# Patient Record
Sex: Female | Born: 1990 | Race: Black or African American | Hispanic: No | Marital: Single | Smoking: Never smoker
Health system: Southern US, Community
[De-identification: ages and names within clinical notes are randomized; demographics above are authoritative.]

## PROBLEM LIST (undated history)

## (undated) ENCOUNTER — Inpatient Hospital Stay (HOSPITAL_COMMUNITY): Payer: Self-pay

## (undated) DIAGNOSIS — Z8742 Personal history of other diseases of the female genital tract: Secondary | ICD-10-CM

## (undated) DIAGNOSIS — D649 Anemia, unspecified: Secondary | ICD-10-CM

## (undated) DIAGNOSIS — D72829 Elevated white blood cell count, unspecified: Secondary | ICD-10-CM

## (undated) HISTORY — DX: Anemia, unspecified: D64.9

## (undated) HISTORY — DX: Personal history of other diseases of the female genital tract: Z87.42

## (undated) HISTORY — PX: NO PAST SURGERIES: SHX2092

## (undated) HISTORY — DX: Elevated white blood cell count, unspecified: D72.829

---
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
## 2014-06-01 ENCOUNTER — Other Ambulatory Visit (HOSPITAL_COMMUNITY)
Admission: RE | Admit: 2014-06-01 | Discharge: 2014-06-01 | Disposition: A | Discharge status: Home / Self Care | Payer: Commercial Indemnity | Source: Ambulatory Visit | Attending: Family Medicine | Admitting: Family Medicine

## 2014-06-01 ENCOUNTER — Other Ambulatory Visit: Payer: Self-pay | Admitting: Physician Assistant

## 2014-06-01 DIAGNOSIS — Z124 Encounter for screening for malignant neoplasm of cervix: Secondary | ICD-10-CM | POA: Diagnosis not present

## 2014-06-03 LAB — CYTOLOGY - PAP

## 2014-06-14 ENCOUNTER — Emergency Department (HOSPITAL_COMMUNITY)
Admission: EM | Admit: 2014-06-14 | Discharge: 2014-06-15 | Disposition: A | Discharge status: Home / Self Care | Payer: Commercial Indemnity | Attending: Emergency Medicine | Admitting: Emergency Medicine

## 2014-06-14 ENCOUNTER — Encounter (HOSPITAL_COMMUNITY): Payer: Self-pay | Admitting: Emergency Medicine

## 2014-06-14 DIAGNOSIS — R040 Epistaxis: Secondary | ICD-10-CM

## 2014-06-14 DIAGNOSIS — R109 Unspecified abdominal pain: Secondary | ICD-10-CM | POA: Insufficient documentation

## 2014-06-14 DIAGNOSIS — Z79899 Other long term (current) drug therapy: Secondary | ICD-10-CM | POA: Diagnosis not present

## 2014-06-14 DIAGNOSIS — J3489 Other specified disorders of nose and nasal sinuses: Secondary | ICD-10-CM | POA: Insufficient documentation

## 2014-06-14 DIAGNOSIS — Z792 Long term (current) use of antibiotics: Secondary | ICD-10-CM | POA: Insufficient documentation

## 2014-06-14 DIAGNOSIS — Z3202 Encounter for pregnancy test, result negative: Secondary | ICD-10-CM | POA: Diagnosis not present

## 2014-06-14 DIAGNOSIS — R1110 Vomiting, unspecified: Secondary | ICD-10-CM | POA: Diagnosis not present

## 2014-06-14 DIAGNOSIS — R1111 Vomiting without nausea: Secondary | ICD-10-CM

## 2014-06-14 LAB — CBC WITH DIFFERENTIAL/PLATELET
BASOS ABS: 0 10*3/uL (ref 0.0–0.1)
Basophils Relative: 0 % (ref 0–1)
EOS PCT: 2 % (ref 0–5)
Eosinophils Absolute: 0.2 10*3/uL (ref 0.0–0.7)
HCT: 38.2 % (ref 36.0–46.0)
Hemoglobin: 13.1 g/dL (ref 12.0–15.0)
LYMPHS PCT: 23 % (ref 12–46)
Lymphs Abs: 2.6 10*3/uL (ref 0.7–4.0)
MCH: 28.9 pg (ref 26.0–34.0)
MCHC: 34.3 g/dL (ref 30.0–36.0)
MCV: 84.1 fL (ref 78.0–100.0)
Monocytes Absolute: 0.6 10*3/uL (ref 0.1–1.0)
Monocytes Relative: 6 % (ref 3–12)
NEUTROS ABS: 7.7 10*3/uL (ref 1.7–7.7)
NEUTROS PCT: 69 % (ref 43–77)
PLATELETS: 301 10*3/uL (ref 150–400)
RBC: 4.54 MIL/uL (ref 3.87–5.11)
RDW: 12.8 % (ref 11.5–15.5)
WBC: 11.2 10*3/uL — AB (ref 4.0–10.5)

## 2014-06-14 LAB — POC URINE PREG, ED: Preg Test, Ur: NEGATIVE

## 2014-06-14 NOTE — ED Notes (Signed)
Pt states she has had 5-6 bouts of vomiting over the past 3 weeks and today she also had a nose bleed. States she was seen by PCP and UTI r/o since she was having pelvic pain. Also states she had a negative pregnancy test. Alert and oriented.

## 2014-06-15 LAB — COMPREHENSIVE METABOLIC PANEL
ALT: 14 U/L (ref 0–35)
AST: 18 U/L (ref 0–37)
Albumin: 3.8 g/dL (ref 3.5–5.2)
Alkaline Phosphatase: 54 U/L (ref 39–117)
Anion gap: 12 (ref 5–15)
BUN: 7 mg/dL (ref 6–23)
CALCIUM: 9.8 mg/dL (ref 8.4–10.5)
CHLORIDE: 102 meq/L (ref 96–112)
CO2: 24 meq/L (ref 19–32)
Creatinine, Ser: 0.9 mg/dL (ref 0.50–1.10)
GFR calc Af Amer: >90 mL/min (ref >90)
Glucose, Bld: 101 mg/dL — ABNORMAL HIGH (ref 70–99)
POTASSIUM: 4 meq/L (ref 3.7–5.3)
SODIUM: 138 meq/L (ref 137–147)
Total Bilirubin: <0.2 mg/dL — ABNORMAL LOW (ref 0.3–1.2)
Total Protein: 8 g/dL (ref 6.0–8.3)

## 2014-06-15 LAB — URINALYSIS, ROUTINE W REFLEX MICROSCOPIC
Bilirubin Urine: NEGATIVE
GLUCOSE, UA: NEGATIVE mg/dL
Hgb urine dipstick: NEGATIVE
KETONES UR: NEGATIVE mg/dL
Nitrite: NEGATIVE
PH: 6 (ref 5.0–8.0)
Protein, ur: NEGATIVE mg/dL
Specific Gravity, Urine: 1.017 (ref 1.005–1.030)
Urobilinogen, UA: 0.2 mg/dL (ref 0.0–1.0)

## 2014-06-15 LAB — URINE MICROSCOPIC-ADD ON

## 2014-06-15 MED ORDER — OXYMETAZOLINE HCL 0.05 % NA SOLN
1.0000 | Freq: Once | NASAL | Status: AC
  Administered 2014-06-15: 1 via NASAL
  Filled 2014-06-15: qty 15

## 2014-06-15 MED ORDER — ONDANSETRON HCL 4 MG PO TABS: 4.0000 mg | ORAL_TABLET | Freq: Three times a day (TID) | ORAL | Status: DC | PRN

## 2014-06-15 NOTE — ED Provider Notes (Signed)
CSN: [id number]     Arrival date & time 06/14/14  2240 History   First MD Initiated Contact with Patient 06/15/14 0126     Chief Complaint  Patient presents with  . Emesis    (Consider location/radiation/quality/duration/timing/severity/associated sxs/prior Treatment) HPI Comments: Patient is a 23 year old female who presents to the emergency department for multiple complaints. Patient's main complaint today is of epistaxis x2. Patient states that epistaxis lasted approximately 5 minutes each time today. She endorses origination of bleeding from right near. Symptoms resolved with constant pressure. Patient states that she has been sneezing a lot today and has been experiencing nasal congestion and rhinorrhea which has required frequent nose blowing. She denies associated fever, difficulty swallowing, drooling, shortness of breath, nausea or vomiting.  Patient also a secondary complaint of emesis. Patient states that she has sporadic emesis "every few days". She states that symptoms began a few weeks ago. Last episode of emesis was more than 48 hours ago. Patient went to her doctor for further evaluation of symptoms as she was also experiencing suprapubic discomfort "a few days ago". Patient states that her workup with her doctor was unremarkable. She states her pain resolved over the weekend. She denies any recurrence of her abdominal pain. No episodes of emesis today. Patient denies abdominal surgical history.  Patient is a 23 y.o. female presenting with vomiting. The history is provided by the patient. No language interpreter was used.  Emesis Associated symptoms: abdominal pain     History reviewed. No pertinent past medical history. History reviewed. No pertinent past surgical history. Family History  Problem Relation Age of Onset  . Migraines Mother   . Hypertension Father    History  Substance Use Topics  . Smoking status: Never Smoker   . Smokeless tobacco: Not on file  . Alcohol  Use: Yes     Comment: occaisional   OB History   Grav Para Term Preterm Abortions TAB SAB Ect Mult Living                  Review of Systems  Constitutional: Negative for fever.  HENT: Positive for congestion, nosebleeds and sneezing. Negative for drooling and trouble swallowing.   Respiratory: Negative for shortness of breath.   Cardiovascular: Negative for chest pain.  Gastrointestinal: Positive for vomiting and abdominal pain.  Genitourinary: Negative for dysuria, hematuria, vaginal bleeding and vaginal discharge.  Neurological: Negative for syncope.  All other systems reviewed and are negative.   Allergies  Review of patient's allergies indicates no known allergies.  Home Medications   Prior to Admission medications   Medication Sig Start Date End Date Taking? Authorizing Provider  ciprofloxacin (CIPRO) 500 MG tablet Take 500 mg by mouth 2 (two) times daily. For 5 days   Yes Historical Provider, MD  PRESCRIPTION MEDICATION Take 1 tablet by mouth daily. Birth control pills   Yes Historical Provider, MD  ondansetron (ZOFRAN) 4 MG tablet Take 1 tablet (4 mg total) by mouth every 8 (eight) hours as needed for nausea or vomiting. 06/15/14   Antony Madura, PA-C   BP 109/92  Pulse 77  Temp(Src) 98 F (36.7 C) (Oral)  Resp 16  SpO2 100%  Physical Exam  Nursing note and vitals reviewed. Constitutional: She is oriented to person, place, and time. She appears well-developed and well-nourished. No distress.  Nontoxic/nonseptic appearing  HENT:  Head: Normocephalic and atraumatic.  Nose: No septal deviation or nasal septal hematoma.  Mouth/Throat: Oropharynx is clear and moist. No oropharyngeal exudate.  Mild maceration to anterior medial R nare. No blood in oropharynx. No active epistaxis. Audible nasal congestion.  Eyes: Conjunctivae and EOM are normal. No scleral icterus.  Neck: Normal range of motion.  Cardiovascular: Normal rate, regular rhythm and normal heart sounds.    Pulmonary/Chest: Effort normal. No respiratory distress. She has no wheezes. She has no rales.  Chest expansion symmetric  Abdominal: Soft. She exhibits no distension. There is no tenderness. There is no rebound and no guarding.  Soft, nontender. No peritoneal signs or masses.  Musculoskeletal: Normal range of motion.  Neurological: She is alert and oriented to person, place, and time.  Skin: Skin is warm and dry. No rash noted. She is not diaphoretic. No erythema. No pallor.  Psychiatric: She has a normal mood and affect. Her behavior is normal.    ED Course  Procedures (including critical care time) Labs Review Labs Reviewed  CBC WITH DIFFERENTIAL - Abnormal; Notable for the following:    WBC 11.2 (*)    All other components within normal limits  COMPREHENSIVE METABOLIC PANEL - Abnormal; Notable for the following:    Glucose, Bld 101 (*)    Total Bilirubin <0.2 (*)    All other components within normal limits  URINALYSIS, ROUTINE W REFLEX MICROSCOPIC - Abnormal; Notable for the following:    Leukocytes, UA SMALL (*)    All other components within normal limits  URINE MICROSCOPIC-ADD ON - Abnormal; Notable for the following:    Bacteria, UA MANY (*)    All other components within normal limits  POC URINE PREG, ED    Imaging Review No results found.   EKG Interpretation None      MDM   Final diagnoses:  Epistaxis  Non-intractable vomiting without nausea, vomiting of unspecified type    23 year old female presents to the emergency department for chief complaint epistaxis. On exam, patient with evidence of mild maceration to anterior, medial right near her. No active bleeding on exam. Bilateral nares patent. No blood in oropharynx. Patient treated in ED with Afrin with no evidence of rebleed. Counseled patient on outpatient management of epistaxis should symptoms recur.  Patient also complaining of sporadic emesis over the past few weeks. Symptoms associated with  suprapubic pain a few days ago. Patient had gone to her primary care doctor for further evaluation of this pain. Her workup at this appointment was unremarkable and her pain has since resolved. Patient denies any abdominal pain or vomiting today. Given her reassuring laboratory work up as well as her benign abdominal exam, do not believe further emergent workup is indicated at this time. I discussed with the patient the need to followup with her primary care doctor should her emesis persist. Should symptoms persist, patient may want to discuss outpatient imaging.  Patient stable and appropriate for discharge. Will prescribe Zofran for persistent nausea as needed. Return precautions discussed and provided. Patient agreeable to plan with no unaddressed concerns. Patient discharged in good condition; VSS.   Filed Vitals:   06/14/14 2317 06/15/14 0220  BP: 122/77 109/92  Pulse: 77 77  Temp: 98.8 F (37.1 C) 98 F (36.7 C)  TempSrc: Oral Oral  Resp: 16 16  SpO2: 100% 100%     Antony Madura, PA-C 06/15/14 [phone]

## 2014-06-15 NOTE — Discharge Instructions (Signed)
Recommend constant pressure to the bridge of your nose should your nosebleed recur. If nosebleed does not resolve spontaneously after 5 minutes of constant pressure, hold pressure continuously for 10 minutes. If nosebleed continues despite pressure, you may use Afrin once a day. Do not use this medication for longer than 3 days as it will cause worsening congestion. Take Zofran as needed for persistent nausea. Recommend he followup with your primary care provider should your sporadic vomiting continue. Return to the emergency department as needed if symptoms worsen.  Nosebleed Nosebleeds can be caused by many conditions, including trauma, infections, polyps, foreign bodies, dry mucous membranes or climate, medicines, and air conditioning. Most nosebleeds occur in the front of the nose. Because of this location, most nosebleeds can be controlled by pinching the nostrils gently and continuously for at least 10 to 20 minutes. The long, continuous pressure allows enough time for the blood to clot. If pressure is released during that 10 to 20 minute time period, the process may have to be started again. The nosebleed may stop by itself or quit with pressure, or it may need concentrated heating (cautery) or pressure from packing. HOME CARE INSTRUCTIONS   If your nose was packed, try to maintain the pack inside until your health care provider removes it. If a gauze pack was used and it starts to fall out, gently replace it or cut the end off. Do not cut if a balloon catheter was used to pack the nose. Otherwise, do not remove unless instructed.  Avoid blowing your nose for 12 hours after treatment. This could dislodge the pack or clot and start the bleeding again.  If the bleeding starts again, sit up and bend forward, gently pinching the front half of your nose continuously for 20 minutes.  If bleeding was caused by dry mucous membranes, use over-the-counter saline nasal spray or gel. This will keep the mucous  membranes moist and allow them to heal. If you must use a lubricant, choose the water-soluble variety. Use it only sparingly and not within several hours of lying down.  Do not use petroleum jelly or mineral oil, as these may drip into the lungs and cause serious problems.  Maintain humidity in your home by using less air conditioning or by using a humidifier.  Do not use aspirin or medicines which make bleeding more likely. Your health care provider can give you recommendations on this.  Resume normal activities as you are able, but try to avoid straining, lifting, or bending at the waist for several days.  If the nosebleeds become recurrent and the cause is unknown, your health care provider may suggest laboratory tests. SEEK MEDICAL CARE IF: You have a fever. SEEK IMMEDIATE MEDICAL CARE IF:   Bleeding recurs and cannot be controlled.  There is unusual bleeding from or bruising on other parts of the body.  Nosebleeds continue.  There is any worsening of the condition which originally brought you in.  You become light-headed, feel faint, become sweaty, or vomit blood. MAKE SURE YOU:   Understand these instructions.  Will watch your condition.  Will get help right away if you are not doing well or get worse. Document Released: 06/26/2005 Document Revised: 01/31/2014 Document Reviewed: 08/17/2009 [facility] Patient Information 2015 [place], [place]. This information is not intended to replace advice given to you by your health care provider. Make sure you discuss any questions you have with your health care provider.

## 2014-06-16 NOTE — ED Provider Notes (Signed)
Medical screening examination/treatment/procedure(s) were performed by non-physician practitioner and as supervising physician I was immediately available for consultation/collaboration.   EKG Interpretation None        Richardean Canal, MD 06/16/14 2119

## 2016-08-13 LAB — OB RESULTS CONSOLE GC/CHLAMYDIA
Chlamydia: NEGATIVE
GC PROBE AMP, GENITAL: NEGATIVE

## 2016-08-13 LAB — OB RESULTS CONSOLE ANTIBODY SCREEN: ANTIBODY SCREEN: NEGATIVE

## 2016-08-13 LAB — OB RESULTS CONSOLE HIV ANTIBODY (ROUTINE TESTING): HIV: NONREACTIVE

## 2016-08-13 LAB — OB RESULTS CONSOLE ABO/RH: RH TYPE: POSITIVE

## 2016-08-13 LAB — OB RESULTS CONSOLE RUBELLA ANTIBODY, IGM: Rubella: IMMUNE

## 2016-08-13 LAB — OB RESULTS CONSOLE HEPATITIS B SURFACE ANTIGEN: Hepatitis B Surface Ag: NEGATIVE

## 2016-08-13 LAB — OB RESULTS CONSOLE RPR: RPR: NONREACTIVE

## 2016-08-30 ENCOUNTER — Inpatient Hospital Stay (HOSPITAL_COMMUNITY)
Admission: AD | Admit: 2016-08-30 | Discharge: 2016-08-30 | Disposition: A | Discharge status: Home / Self Care | Payer: Commercial Indemnity | Source: Ambulatory Visit | Attending: Obstetrics and Gynecology | Admitting: Obstetrics and Gynecology

## 2016-08-30 ENCOUNTER — Encounter (HOSPITAL_COMMUNITY): Payer: Self-pay

## 2016-08-30 ENCOUNTER — Inpatient Hospital Stay (HOSPITAL_COMMUNITY): Payer: Commercial Indemnity

## 2016-08-30 DIAGNOSIS — Z3491 Encounter for supervision of normal pregnancy, unspecified, first trimester: Secondary | ICD-10-CM

## 2016-08-30 DIAGNOSIS — O210 Mild hyperemesis gravidarum: Secondary | ICD-10-CM | POA: Diagnosis not present

## 2016-08-30 DIAGNOSIS — O209 Hemorrhage in early pregnancy, unspecified: Secondary | ICD-10-CM | POA: Diagnosis not present

## 2016-08-30 DIAGNOSIS — Z3A09 9 weeks gestation of pregnancy: Secondary | ICD-10-CM | POA: Diagnosis not present

## 2016-08-30 DIAGNOSIS — O219 Vomiting of pregnancy, unspecified: Secondary | ICD-10-CM

## 2016-08-30 DIAGNOSIS — O26899 Other specified pregnancy related conditions, unspecified trimester: Secondary | ICD-10-CM

## 2016-08-30 DIAGNOSIS — R109 Unspecified abdominal pain: Secondary | ICD-10-CM

## 2016-08-30 LAB — POCT PREGNANCY, URINE: PREG TEST UR: POSITIVE — AB

## 2016-08-30 LAB — COMPREHENSIVE METABOLIC PANEL
ALBUMIN: 4.6 g/dL (ref 3.5–5.0)
ALK PHOS: 50 U/L (ref 38–126)
ALT: 23 U/L (ref 14–54)
AST: 28 U/L (ref 15–41)
Anion gap: 9 (ref 5–15)
BUN: 10 mg/dL (ref 6–20)
CALCIUM: 10.4 mg/dL — AB (ref 8.9–10.3)
CO2: 25 mmol/L (ref 22–32)
CREATININE: 0.68 mg/dL (ref 0.44–1.00)
Chloride: 101 mmol/L (ref 101–111)
GFR calc Af Amer: >60 mL/min (ref >60)
GFR calc non Af Amer: >60 mL/min (ref >60)
GLUCOSE: 87 mg/dL (ref 65–99)
Potassium: 3.7 mmol/L (ref 3.5–5.1)
SODIUM: 135 mmol/L (ref 135–145)
Total Bilirubin: 0.6 mg/dL (ref 0.3–1.2)
Total Protein: 8.6 g/dL — ABNORMAL HIGH (ref 6.5–8.1)

## 2016-08-30 LAB — URINE MICROSCOPIC-ADD ON: RBC / HPF: NONE SEEN RBC/hpf (ref 0–5)

## 2016-08-30 LAB — URINALYSIS, ROUTINE W REFLEX MICROSCOPIC
Glucose, UA: NEGATIVE mg/dL
Ketones, ur: 15 mg/dL — AB
Leukocytes, UA: NEGATIVE
Nitrite: NEGATIVE
PROTEIN: 30 mg/dL — AB
Specific Gravity, Urine: 1.025 (ref 1.005–1.030)
pH: 6 (ref 5.0–8.0)

## 2016-08-30 LAB — ABO/RH: ABO/RH(D): B POS

## 2016-08-30 LAB — CBC
HCT: 38.7 % (ref 36.0–46.0)
HEMOGLOBIN: 12.9 g/dL (ref 12.0–15.0)
MCH: 25 pg — ABNORMAL LOW (ref 26.0–34.0)
MCHC: 33.3 g/dL (ref 30.0–36.0)
MCV: 75 fL — ABNORMAL LOW (ref 78.0–100.0)
PLATELETS: 351 10*3/uL (ref 150–400)
RBC: 5.16 MIL/uL — ABNORMAL HIGH (ref 3.87–5.11)
RDW: 17.1 % — AB (ref 11.5–15.5)
WBC: 10.3 10*3/uL (ref 4.0–10.5)

## 2016-08-30 LAB — WET PREP, GENITAL
CLUE CELLS WET PREP: NONE SEEN
Sperm: NONE SEEN
Trich, Wet Prep: NONE SEEN
Yeast Wet Prep HPF POC: NONE SEEN

## 2016-08-30 LAB — HCG, QUANTITATIVE, PREGNANCY: HCG, BETA CHAIN, QUANT, S: 146078 m[IU]/mL — AB (ref <5)

## 2016-08-30 MED ORDER — LACTATED RINGERS IV BOLUS (SEPSIS)
1000.0000 mL | Freq: Once | INTRAVENOUS | Status: AC
  Administered 2016-08-30: 1000 mL via INTRAVENOUS

## 2016-08-30 MED ORDER — PROMETHAZINE HCL 25 MG PO TABS: 25.0000 mg | ORAL_TABLET | Freq: Four times a day (QID) | ORAL | 0 refills | Status: DC | PRN

## 2016-08-30 MED ORDER — ONDANSETRON HCL 4 MG/2ML IJ SOLN
4.0000 mg | Freq: Once | INTRAMUSCULAR | Status: AC
  Administered 2016-08-30: 4 mg via INTRAVENOUS
  Filled 2016-08-30: qty 2

## 2016-08-30 MED ORDER — PROMETHAZINE HCL 25 MG/ML IJ SOLN
25.0000 mg | Freq: Once | INTRAVENOUS | Status: AC
  Administered 2016-08-30: 25 mg via INTRAVENOUS
  Filled 2016-08-30: qty 1

## 2016-08-30 MED ORDER — FAMOTIDINE IN NACL 20-0.9 MG/50ML-% IV SOLN
20.0000 mg | Freq: Once | INTRAVENOUS | Status: AC
  Administered 2016-08-30: 20 mg via INTRAVENOUS
  Filled 2016-08-30: qty 50

## 2016-08-30 NOTE — Discharge Instructions (Signed)
Diclegis:  Start with 2 tablets every evening. If symptoms persist, add 1 tablet every morning. If symptoms persist, add 1 tablet at mid-day.   Morning Sickness Morning sickness is when you feel sick to your stomach (nauseous) during pregnancy. This nauseous feeling may or may not come with vomiting. It often occurs in the morning but can be a problem any time of day. Morning sickness is most common during the first trimester, but it may continue throughout pregnancy. While morning sickness is unpleasant, it is usually harmless unless you develop severe and continual vomiting (hyperemesis gravidarum). This condition requires more intense treatment. What are the causes? The cause of morning sickness is not completely known but seems to be related to normal hormonal changes that occur in pregnancy. What increases the risk? You are at greater risk if you:  Experienced nausea or vomiting before your pregnancy.  Had morning sickness during a previous pregnancy.  Are pregnant with more than one baby, such as twins. How is this treated? Do not use any medicines (prescription, over-the-counter, or herbal) for morning sickness without first talking to your health care provider. Your health care provider may prescribe or recommend:  Vitamin B6 supplements.  Anti-nausea medicines.  The herbal medicine ginger. Follow these instructions at home:  Only take over-the-counter or prescription medicines as directed by your health care provider.  Taking multivitamins before getting pregnant can prevent or decrease the severity of morning sickness in most women.  Eat a piece of dry toast or unsalted crackers before getting out of bed in the morning.  Eat five or six small meals a day.  Eat dry and bland foods (rice, baked potato). Foods high in carbohydrates are often helpful.  Do not drink liquids with your meals. Drink liquids between meals.  Avoid greasy, fatty, and spicy foods.  Get someone to  cook for you if the smell of any food causes nausea and vomiting.  If you feel nauseous after taking prenatal vitamins, take the vitamins at night or with a snack.  Snack on protein foods (nuts, yogurt, cheese) between meals if you are hungry.  Eat unsweetened gelatins for desserts.  Wearing an acupressure wristband (worn for sea sickness) may be helpful.  Acupuncture may be helpful.  Do not smoke.  Get a humidifier to keep the air in your house free of odors.  Get plenty of fresh air. Contact a health care provider if:  Your home remedies are not working, and you need medicine.  You feel dizzy or lightheaded.  You are losing weight. Get help right away if:  You have persistent and uncontrolled nausea and vomiting.  You pass out (faint). This information is not intended to replace advice given to you by your health care provider. Make sure you discuss any questions you have with your health care provider. Document Released: 11/07/2006 Document Revised: 02/22/2016 Document Reviewed: 03/03/2013 Elsevier Interactive Patient Education  2017 [employer]    Eating Plan for Hyperemesis Gravidarum Hyperemesis gravidarum is a severe form of morning sickness. Because this condition causes severe nausea and vomiting, it can lead to dehydration, malnutrition, and weight loss. One way to lessen the symptoms of nausea and vomiting is to follow the eating plan for hyperemesis gravidarum. It is often used along with prescribed medicines to control your symptoms. What can I do to relieve my symptoms? Listen to your body. Everyone is different and has different preferences. Find what works best for you. Take any of the following actions that are  helpful to you:  Eat and drink slowly.  Eat 5-6 small meals daily instead of 3 large meals.  Eat crackers before you get out of bed in the morning.  Try having a snack in the middle of the night.  Starchy foods are usually tolerated well.  Examples include cereal, toast, bread, potatoes, pasta, rice, and pretzels.  Ginger may help with nausea. Add  tsp ground ginger to hot tea or choose ginger tea.  Try drinking 100% fruit juice or an electrolyte drink. An electrolyte drink contains sodium, potassium, and chloride.  Continue to take your prenatal vitamins as told by your health care provider. If you are having trouble taking your prenatal vitamins, talk with your health care provider about different options.  Include at least 1 serving of protein with your meals and snacks. Protein options include meats or poultry, beans, nuts, eggs, and yogurt. Try eating a protein-rich snack before bed. Examples of these snacks include cheese and crackers or half of a peanut butter or [place] sandwich.  Consider eliminating foods that trigger your symptoms. These may include spicy foods, coffee, high-fat foods, very sweet foods, and acidic foods.  Try meals that have more protein combined with bland, salty, lower-fat, and dry foods, such as nuts, seeds, pretzels, crackers, and cereal.  Talk with your healthcare provider about starting a supplement of vitamin B6.  Have fluids that are cold, clear, and carbonated or sour. Examples include lemonade, ginger ale, lemon-lime soda, ice water, and sparkling water.  Try lemon or mint tea.  Try brushing your teeth or using a mouth rinse after meals. What should I avoid to reduce my symptoms? Avoiding some of the following things may help reduce your symptoms.  Foods with strong smells. Try eating meals in well-ventilated areas that are free of odors.  Drinking water or other beverages with meals. Try not to drink anything during the 30 minutes before and after your meals.  Drinking more than 1 cup of fluid at a time. Sometimes using a straw helps.  Fried or high-fat foods, such as butter and cream sauces.  Spicy foods.  Skipping meals as best as you can. Nausea can be more intense on an empty  stomach. If you cannot tolerate food at that time, do not force it. Try sucking on ice chips or other frozen items, and make up for missed calories later.  Lying down within 2 hours after eating.  Environmental triggers. These may include smoky rooms, closed spaces, rooms with strong smells, warm or humid places, overly loud and noisy rooms, and rooms with motion or flickering lights.  Quick and sudden changes in your movement. This information is not intended to replace advice given to you by your health care provider. Make sure you discuss any questions you have with your health care provider. Document Released: 07/14/2007 Document Revised: 05/15/2016 Document Reviewed: 04/16/2016 Elsevier Interactive Patient Education  2017 [employer]

## 2016-08-30 NOTE — MAU Note (Signed)
Patient presents with N/V unable to keep anything down, taking Dicleges not helping.  Having mid back pain x 2 to 3 days, having dark brown discharge x 2 days.

## 2016-08-30 NOTE — MAU Provider Note (Signed)
History     CSN: [id number]  Arrival date and time: 08/30/16 1001   First Provider Initiated Contact with Patient 08/30/16 1030      Chief Complaint  Patient presents with  . Morning Sickness   HPI  Bonnie Erickson is a 25 y.o. G3P0020 at 538w4d by LMP who presents with nausea/vomiting, low back pain, low abdominal pain, & vaginal bleeding. Was seen at [facility] for nurse interview this week & prescribed Diclegis. Has initial OB appt 12/12.  Reports nausea & vomiting for the last 2 weeks. Unable to keep down foods or fluids x 1 week. Has only had some ginger ale today and vomited that up. Reports vomiting 3 times today. Has been taking diclegis nightly for the last 4 days without relief. Denies heartburn, epigastric pain, or diarrhea.  Low back pain & intermittent low abdominal pain. Pain worse with vomiting. Currently no pain. Reports episode of bright red vaginal bleeding a week ago. Since then reports brown spotting that she has to wear a pantyliner for. Denies dysuria, vaginal discharge, or recent intercourse. Last BM a week ago.   OB History    Gravida Para Term Preterm AB Living   3 0 0 0 2 0   SAB TAB Ectopic Multiple Live Births   0 2 0 0 0      Past Medical History:  Diagnosis Date  . Medical history non-contributory     Past Surgical History:  Procedure Laterality Date  . NO PAST SURGERIES      Family History  Problem Relation Age of Onset  . Migraines Mother   . Hypertension Father     Social History  Substance Use Topics  . Smoking status: Never Smoker  . Smokeless tobacco: Not on file  . Alcohol use Yes     Comment: occaisional    Allergies: No Known Allergies  Prescriptions Prior to Admission  Medication Sig Dispense Refill Last Dose  . ciprofloxacin (CIPRO) 500 MG tablet Take 500 mg by mouth 2 (two) times daily. For 5 days   06/13/14  . DICLEGIS 10-10 MG TBEC TK 1 T PO QD  6   . ondansetron (ZOFRAN) 4 MG tablet Take 1 tablet (4 mg total) by mouth every 8  (eight) hours as needed for nausea or vomiting. 12 tablet 0   . Prenat-FeAsp-Meth-FA-DHA w/o A (PRENATE PIXIE) 10-0.6-0.4-200 MG CAPS TK ONE C PO QD UTD  9   . [DISCONTINUED] PRESCRIPTION MEDICATION Take 1 tablet by mouth daily. Birth control pills   06/14/2014 at Unknown time    Review of Systems  Constitutional: Negative for chills and fever.  Gastrointestinal: Positive for abdominal pain, constipation, nausea and vomiting. Negative for diarrhea and heartburn.  Genitourinary: Negative for dysuria.       + vaginal bleeding  Neurological: Negative for dizziness and headaches.   Physical Exam   Blood pressure 124/72, pulse 96, temperature 98.4 F (36.9 C), resp. rate 18, height 5' 5.5" (1.664 m), weight 139 lb 1.9 oz (63.1 kg), last menstrual period 06/24/2016, SpO2 100 %.  Physical Exam  Nursing note and vitals reviewed. Constitutional: She is oriented to person, place, and time. She appears well-developed and well-nourished. No distress.  HENT:  Head: Normocephalic and atraumatic.  Eyes: Conjunctivae are normal. Right eye exhibits no discharge. Left eye exhibits no discharge. No scleral icterus.  Neck: Normal range of motion.  Cardiovascular: Normal rate, regular rhythm and normal heart sounds.   No murmur heard. Respiratory: Effort normal and breath  sounds normal. No respiratory distress. She has no wheezes.  GI: Soft. Bowel sounds are normal. She exhibits no distension. There is tenderness in the suprapubic area. There is no rebound, no guarding and no CVA tenderness.  Genitourinary: Uterus is enlarged. Cervix exhibits no motion tenderness and no friability. Right adnexum displays tenderness and fullness. Left adnexum displays no mass and no tenderness. No bleeding in the vagina. Tenderness: small amount of tan discharge. Vaginal discharge found.  Genitourinary Comments: Cervix closed  Musculoskeletal:       Lumbar back: Normal.  Neurological: She is alert and oriented to person,  place, and time.  Skin: Skin is warm and dry. She is not diaphoretic.  Psychiatric: She has a normal mood and affect. Her behavior is normal. Judgment and thought content normal.    MAU Course  Procedures Results for orders placed or performed during the hospital encounter of 08/30/16 (from the past 24 hour(s))  Urinalysis, Routine w reflex microscopic (not at [facility])     Status: Abnormal   Collection Time: 08/30/16 10:15 AM  Result Value Ref Range   Color, Urine AMBER (A) YELLOW   APPearance CLEAR CLEAR   Specific Gravity, Urine 1.025 1.005 - 1.030   pH 6.0 5.0 - 8.0   Glucose, UA NEGATIVE NEGATIVE mg/dL   Hgb urine dipstick SMALL (A) NEGATIVE   Bilirubin Urine SMALL (A) NEGATIVE   Ketones, ur 15 (A) NEGATIVE mg/dL   Protein, ur 30 (A) NEGATIVE mg/dL   Nitrite NEGATIVE NEGATIVE   Leukocytes, UA NEGATIVE NEGATIVE  Urine microscopic-add on     Status: Abnormal   Collection Time: 08/30/16 10:15 AM  Result Value Ref Range   Squamous Epithelial / LPF 0-5 (A) NONE SEEN   WBC, UA 0-5 0 - 5 WBC/hpf   RBC / HPF NONE SEEN 0 - 5 RBC/hpf   Bacteria, UA FEW (A) NONE SEEN   Urine-Other MUCOUS PRESENT   Pregnancy, urine POC     Status: Abnormal   Collection Time: 08/30/16 10:22 AM  Result Value Ref Range   Preg Test, Ur POSITIVE (A) NEGATIVE  Wet prep, genital     Status: Abnormal   Collection Time: 08/30/16 10:42 AM  Result Value Ref Range   Yeast Wet Prep HPF POC NONE SEEN NONE SEEN   Trich, Wet Prep NONE SEEN NONE SEEN   Clue Cells Wet Prep HPF POC NONE SEEN NONE SEEN   WBC, Wet Prep HPF POC MODERATE (A) NONE SEEN   Sperm NONE SEEN   CBC     Status: Abnormal   Collection Time: 08/30/16 11:02 AM  Result Value Ref Range   WBC 10.3 4.0 - 10.5 K/uL   RBC 5.16 (H) 3.87 - 5.11 MIL/uL   Hemoglobin 12.9 12.0 - 15.0 g/dL   HCT 16.1 09.6 - 04.5 %   MCV 75.0 (L) 78.0 - 100.0 fL   MCH 25.0 (L) 26.0 - 34.0 pg   MCHC 33.3 30.0 - 36.0 g/dL   RDW 40.9 (H) 81.1 - 91.4 %   Platelets 351 150 -  400 K/uL  ABO/Rh     Status: None (Preliminary result)   Collection Time: 08/30/16 11:02 AM  Result Value Ref Range   ABO/RH(D) B POS   hCG, quantitative, pregnancy     Status: Abnormal   Collection Time: 08/30/16 11:02 AM  Result Value Ref Range   hCG, Beta Chain, Quant, S 146,078 (H) <5 mIU/mL  Comprehensive metabolic panel     Status: Abnormal  Collection Time: 08/30/16 11:02 AM  Result Value Ref Range   Sodium 135 135 - 145 mmol/L   Potassium 3.7 3.5 - 5.1 mmol/L   Chloride 101 101 - 111 mmol/L   CO2 25 22 - 32 mmol/L   Glucose, Bld 87 65 - 99 mg/dL   BUN 10 6 - 20 mg/dL   Creatinine, Ser 1.61 0.44 - 1.00 mg/dL   Calcium 09.6 (H) 8.9 - 10.3 mg/dL   Total Protein 8.6 (H) 6.5 - 8.1 g/dL   Albumin 4.6 3.5 - 5.0 g/dL   AST 28 15 - 41 U/L   ALT 23 14 - 54 U/L   Alkaline Phosphatase 50 38 - 126 U/L   Total Bilirubin 0.6 0.3 - 1.2 mg/dL   GFR calc non Af Amer >60 >60 mL/min   GFR calc Af Amer >60 >60 mL/min   Anion gap 9 5 - 15   US Ob Comp Less 14 Wks  Result Date: 08/30/2016 CLINICAL DATA:  Abdominal pain affecting pregnancy. EXAM: OBSTETRIC <14 WK [place] AND TRANSVAGINAL OB US TECHNIQUE: Both transabdominal and transvaginal ultrasound examinations were performed for complete evaluation of the gestation as well as the maternal uterus, adnexal regions, and pelvic cul-de-sac. Transvaginal technique was performed to assess early pregnancy. COMPARISON:  None. FINDINGS: Intrauterine gestational sac: Single Yolk sac:  Visualized. Embryo:  Visualized. Cardiac Activity: Visualized. Heart Rate: 186  bpm CRL:  29  mm   9 w   4 d                  [place] EDC: 03/31/2017 Subchorionic hemorrhage:  None visualized. Maternal uterus/adnexae: Subchorionic hemorrhage: None Right ovary: Normal Left ovary: Normal Other :None Free fluid:  None IMPRESSION: 1. Single living intrauterine gestation. The estimated gestational age is 9 weeks and 4 days. This is concordant with the clinical gestational age.  Electronically Signed   By: Signa Kell M.D.   On: 08/30/2016 12:14   US Ob Transvaginal  Result Date: 08/30/2016 CLINICAL DATA:  Abdominal pain affecting pregnancy. EXAM: OBSTETRIC <14 WK [place] AND TRANSVAGINAL OB US TECHNIQUE: Both transabdominal and transvaginal ultrasound examinations were performed for complete evaluation of the gestation as well as the maternal uterus, adnexal regions, and pelvic cul-de-sac. Transvaginal technique was performed to assess early pregnancy. COMPARISON:  None. FINDINGS: Intrauterine gestational sac: Single Yolk sac:  Visualized. Embryo:  Visualized. Cardiac Activity: Visualized. Heart Rate: 186  bpm CRL:  29  mm   9 w   4 d                  [place] EDC: 03/31/2017 Subchorionic hemorrhage:  None visualized. Maternal uterus/adnexae: Subchorionic hemorrhage: None Right ovary: Normal Left ovary: Normal Other :None Free fluid:  None IMPRESSION: 1. Single living intrauterine gestation. The estimated gestational age is 9 weeks and 4 days. This is concordant with the clinical gestational age. Electronically Signed   By: Signa Kell M.D.   On: 08/30/2016 12:14     MDM +UPT UA, wet prep, GC/chlamydia, CBC, ABO/Rh, quant hCG, HIV, and [place] today to rule out ectopic pregnancy Ultrasound shows SIUP with cardiac activity B positive Phenergan 25 mg infusion in bag of D5LR & pepcid 20 mg IVPB Pt reports improvement in symptoms S/w Dr. Estanislado Pandy regarding pt's presentations, treatment, & u/s result. Ok to discharge home with additional antiemetic. Pt to call office prior to future MAU visits.   Assessment and Plan  A: 1. Nausea and vomiting during pregnancy prior to  [redacted] weeks gestation   2. Vaginal bleeding in pregnancy, first trimester   3. Abdominal pain affecting pregnancy   4. Normal IUP (intrauterine pregnancy) on prenatal ultrasound, first trimester    P: Discharge home Rx phenergan Continue diclegis; additional doses PRN for symptoms Keep Ob appt as scheduled or call  office as needed  Bonnie Erickson 08/30/2016, 10:30 AM

## 2016-08-31 LAB — HIV ANTIBODY (ROUTINE TESTING W REFLEX): HIV Screen 4th Generation wRfx: NONREACTIVE

## 2016-08-31 LAB — CULTURE, OB URINE: Culture: 30000 — AB

## 2016-09-02 LAB — GC/CHLAMYDIA PROBE AMP (~~LOC~~) NOT AT ARMC
CHLAMYDIA, DNA PROBE: NEGATIVE
NEISSERIA GONORRHEA: NEGATIVE

## 2016-09-21 ENCOUNTER — Encounter (HOSPITAL_COMMUNITY): Payer: Self-pay | Admitting: *Deleted

## 2016-09-21 ENCOUNTER — Inpatient Hospital Stay (HOSPITAL_COMMUNITY)
Admission: AD | Admit: 2016-09-21 | Discharge: 2016-09-21 | Disposition: A | Discharge status: Home / Self Care | Payer: Commercial Indemnity | Source: Ambulatory Visit | Attending: Obstetrics and Gynecology | Admitting: Obstetrics and Gynecology

## 2016-09-21 DIAGNOSIS — Z8249 Family history of ischemic heart disease and other diseases of the circulatory system: Secondary | ICD-10-CM | POA: Insufficient documentation

## 2016-09-21 DIAGNOSIS — Z3A12 12 weeks gestation of pregnancy: Secondary | ICD-10-CM | POA: Insufficient documentation

## 2016-09-21 DIAGNOSIS — O219 Vomiting of pregnancy, unspecified: Secondary | ICD-10-CM

## 2016-09-21 DIAGNOSIS — O99281 Endocrine, nutritional and metabolic diseases complicating pregnancy, first trimester: Secondary | ICD-10-CM | POA: Insufficient documentation

## 2016-09-21 DIAGNOSIS — E860 Dehydration: Secondary | ICD-10-CM

## 2016-09-21 LAB — URINALYSIS, ROUTINE W REFLEX MICROSCOPIC
BILIRUBIN URINE: NEGATIVE
Glucose, UA: NEGATIVE mg/dL
Ketones, ur: 80 mg/dL — AB
NITRITE: NEGATIVE
PROTEIN: 100 mg/dL — AB
SPECIFIC GRAVITY, URINE: 1.024 (ref 1.005–1.030)
pH: 5 (ref 5.0–8.0)

## 2016-09-21 LAB — CBC
HEMATOCRIT: 34.8 % — AB (ref 36.0–46.0)
HEMOGLOBIN: 11.5 g/dL — AB (ref 12.0–15.0)
MCH: 25.1 pg — AB (ref 26.0–34.0)
MCHC: 33 g/dL (ref 30.0–36.0)
MCV: 76 fL — AB (ref 78.0–100.0)
Platelets: 294 10*3/uL (ref 150–400)
RBC: 4.58 MIL/uL (ref 3.87–5.11)
RDW: 18.7 % — ABNORMAL HIGH (ref 11.5–15.5)
WBC: 11.4 10*3/uL — ABNORMAL HIGH (ref 4.0–10.5)

## 2016-09-21 LAB — COMPREHENSIVE METABOLIC PANEL
ALBUMIN: 3.8 g/dL (ref 3.5–5.0)
ALK PHOS: 47 U/L (ref 38–126)
ALT: 15 U/L (ref 14–54)
ANION GAP: 8 (ref 5–15)
AST: 18 U/L (ref 15–41)
BUN: 7 mg/dL (ref 6–20)
CHLORIDE: 102 mmol/L (ref 101–111)
CO2: 24 mmol/L (ref 22–32)
Calcium: 9.6 mg/dL (ref 8.9–10.3)
Creatinine, Ser: 0.61 mg/dL (ref 0.44–1.00)
GFR calc non Af Amer: >60 mL/min (ref >60)
GLUCOSE: 94 mg/dL (ref 65–99)
POTASSIUM: 3.5 mmol/L (ref 3.5–5.1)
SODIUM: 134 mmol/L — AB (ref 135–145)
Total Bilirubin: 0.3 mg/dL (ref 0.3–1.2)
Total Protein: 7.6 g/dL (ref 6.5–8.1)

## 2016-09-21 MED ORDER — PROMETHAZINE HCL 25 MG/ML IJ SOLN
25.0000 mg | Freq: Once | INTRAVENOUS | Status: AC
  Administered 2016-09-21: 25 mg via INTRAVENOUS
  Filled 2016-09-21: qty 1

## 2016-09-21 MED ORDER — FAMOTIDINE IN NACL 20-0.9 MG/50ML-% IV SOLN
20.0000 mg | Freq: Once | INTRAVENOUS | Status: AC
  Administered 2016-09-21: 20 mg via INTRAVENOUS
  Filled 2016-09-21: qty 50

## 2016-09-21 NOTE — Progress Notes (Signed)
ERin Lawrence NP in earlier to discuss d/c plan. Written and verbal d/c instructions given and understanding voiced. ?

## 2016-09-21 NOTE — Discharge Instructions (Signed)
Eating Plan for Vomiting During Pregnancy Hyperemesis gravidarum is a severe form of morning sickness. Because this condition causes severe nausea and vomiting, it can lead to dehydration, malnutrition, and weight loss. One way to lessen the symptoms of nausea and vomiting is to follow the eating plan for hyperemesis gravidarum. It is often used along with prescribed medicines to control your symptoms. What can I do to relieve my symptoms? Listen to your body. Everyone is different and has different preferences. Find what works best for you. Take any of the following actions that are helpful to you:  Eat and drink slowly.  Eat 5-6 small meals daily instead of 3 large meals.  Eat crackers before you get out of bed in the morning.  Try having a snack in the middle of the night.  Starchy foods are usually tolerated well. Examples include cereal, toast, bread, potatoes, pasta, rice, and pretzels.  Ginger may help with nausea. Add  tsp ground ginger to hot tea or choose ginger tea.  Try drinking 100% fruit juice or an electrolyte drink. An electrolyte drink contains sodium, potassium, and chloride.  Continue to take your prenatal vitamins as told by your health care provider. If you are having trouble taking your prenatal vitamins, talk with your health care provider about different options.  Include at least 1 serving of protein with your meals and snacks. Protein options include meats or poultry, beans, nuts, eggs, and yogurt. Try eating a protein-rich snack before bed. Examples of these snacks include cheese and crackers or half of a peanut butter or [place] sandwich.  Consider eliminating foods that trigger your symptoms. These may include spicy foods, coffee, high-fat foods, very sweet foods, and acidic foods.  Try meals that have more protein combined with bland, salty, lower-fat, and dry foods, such as nuts, seeds, pretzels, crackers, and cereal.  Talk with your healthcare provider about  starting a supplement of vitamin B6.  Have fluids that are cold, clear, and carbonated or sour. Examples include lemonade, ginger ale, lemon-lime soda, ice water, and sparkling water.  Try lemon or mint tea.  Try brushing your teeth or using a mouth rinse after meals. What should I avoid to reduce my symptoms? Avoiding some of the following things may help reduce your symptoms.  Foods with strong smells. Try eating meals in well-ventilated areas that are free of odors.  Drinking water or other beverages with meals. Try not to drink anything during the 30 minutes before and after your meals.  Drinking more than 1 cup of fluid at a time. Sometimes using a straw helps.  Fried or high-fat foods, such as butter and cream sauces.  Spicy foods.  Skipping meals as best as you can. Nausea can be more intense on an empty stomach. If you cannot tolerate food at that time, do not force it. Try sucking on ice chips or other frozen items, and make up for missed calories later.  Lying down within 2 hours after eating.  Environmental triggers. These may include smoky rooms, closed spaces, rooms with strong smells, warm or humid places, overly loud and noisy rooms, and rooms with motion or flickering lights.  Quick and sudden changes in your movement. This information is not intended to replace advice given to you by your health care provider. Make sure you discuss any questions you have with your health care provider. Document Released: 07/14/2007 Document Revised: 05/15/2016 Document Reviewed: 04/16/2016 Elsevier Interactive Patient Education  2017 [employer]

## 2016-09-21 NOTE — MAU Provider Note (Signed)
History     CSN: [id number]  Arrival date and time: 09/21/16 0000   First Provider Initiated Contact with Patient 09/21/16 0048      Chief Complaint  Patient presents with  . Emesis During Pregnancy   HPI Casimiro Needlealiyah Rachal is a 25 y.o. G3P0020 at 5669w5d who presents with nausea & vomiting. Has been unable to keep down antiemetics; takes diclegis & promethazine. Last took promethazine yesterday & diclegis this evening. Has vomited countless times today. Attempted to eat chicken nuggets this afternoon but unable to keep them down. Denies heartburn, abdominal pain, vaginal bleeding, fever, or diarrhea. Endorses constipation, Last BM earlier this week. Is not taking anything for constipation.   OB History    Gravida Para Term Preterm AB Living   3 0 0 0 2 0   SAB TAB Ectopic Multiple Live Births   0 2 0 0 0      Past Medical History:  Diagnosis Date  . Medical history non-contributory     Past Surgical History:  Procedure Laterality Date  . NO PAST SURGERIES      Family History  Problem Relation Age of Onset  . Migraines Mother   . Hypertension Father     Social History  Substance Use Topics  . Smoking status: Never Smoker  . Smokeless tobacco: Never Used  . Alcohol use Yes     Comment: occaisional    Allergies: No Known Allergies  Prescriptions Prior to Admission  Medication Sig Dispense Refill Last Dose  . Doxylamine-Pyridoxine (DICLEGIS) 10-10 MG TBEC Take 1 tablet by mouth at bedtime.   09/20/2016 at Unknown time  . Prenatal Vit-Fe Fumarate-FA (PRENATAL MULTIVITAMIN) TABS tablet Take 1 tablet by mouth daily at 12 noon.   09/20/2016 at Unknown time  . promethazine (PHENERGAN) 25 MG tablet Take 1 tablet (25 mg total) by mouth every 6 (six) hours as needed for nausea or vomiting. 30 tablet 0 09/20/2016 at Unknown time    Review of Systems  Constitutional: Negative for chills, fever and weight loss.  Gastrointestinal: Positive for nausea and vomiting. Negative for  abdominal pain, constipation, diarrhea and heartburn.  Genitourinary: Negative.    Physical Exam   Blood pressure 123/72, pulse 77, temperature 97.9 F (36.6 C), resp. rate 16, height 5' 5.5" (1.664 m), weight 140 lb 9.6 oz (63.8 kg), last menstrual period 06/24/2016.  Physical Exam  Nursing note and vitals reviewed. Constitutional: She is oriented to person, place, and time. She appears well-developed and well-nourished. No distress.  HENT:  Head: Normocephalic and atraumatic.  Eyes: Conjunctivae are normal. Right eye exhibits no discharge. Left eye exhibits no discharge. No scleral icterus.  Neck: Normal range of motion.  Cardiovascular: Normal rate, regular rhythm and normal heart sounds.   No murmur heard. Respiratory: Effort normal and breath sounds normal. No respiratory distress. She has no wheezes.  GI: Soft. Bowel sounds are normal. She exhibits no distension. There is no tenderness. There is no rebound and no guarding.  Neurological: She is alert and oriented to person, place, and time.  Skin: Skin is warm and dry. She is not diaphoretic.  Psychiatric: She has a normal mood and affect. Her behavior is normal. Judgment and thought content normal.    MAU Course  Procedures Results for orders placed or performed during the hospital encounter of 09/21/16 (from the past 24 hour(s))  Urinalysis, Routine w reflex microscopic     Status: Abnormal   Collection Time: 09/21/16 12:13 AM  Result Value Ref  Range   Color, Urine YELLOW YELLOW   APPearance HAZY (A) CLEAR   Specific Gravity, Urine 1.024 1.005 - 1.030   pH 5.0 5.0 - 8.0   Glucose, UA NEGATIVE NEGATIVE mg/dL   Hgb urine dipstick LARGE (A) NEGATIVE   Bilirubin Urine NEGATIVE NEGATIVE   Ketones, ur 80 (A) NEGATIVE mg/dL   Protein, ur 409100 (A) NEGATIVE mg/dL   Nitrite NEGATIVE NEGATIVE   Leukocytes, UA TRACE (A) NEGATIVE   RBC / HPF TOO NUMEROUS TO COUNT 0 - 5 RBC/hpf   WBC, UA 0-5 0 - 5 WBC/hpf   Bacteria, UA RARE (A)  NONE SEEN   Squamous Epithelial / LPF 0-5 (A) NONE SEEN   Mucous PRESENT   Comprehensive metabolic panel     Status: Abnormal   Collection Time: 09/21/16 12:38 AM  Result Value Ref Range   Sodium 134 (L) 135 - 145 mmol/L   Potassium 3.5 3.5 - 5.1 mmol/L   Chloride 102 101 - 111 mmol/L   CO2 24 22 - 32 mmol/L   Glucose, Bld 94 65 - 99 mg/dL   BUN 7 6 - 20 mg/dL   Creatinine, Ser 8.110.61 0.44 - 1.00 mg/dL   Calcium 9.6 8.9 - 91.410.3 mg/dL   Total Protein 7.6 6.5 - 8.1 g/dL   Albumin 3.8 3.5 - 5.0 g/dL   AST 18 15 - 41 U/L   ALT 15 14 - 54 U/L   Alkaline Phosphatase 47 38 - 126 U/L   Total Bilirubin 0.3 0.3 - 1.2 mg/dL   GFR calc non Af Amer >60 >60 mL/min   GFR calc Af Amer >60 >60 mL/min   Anion gap 8 5 - 15  CBC     Status: Abnormal   Collection Time: 09/21/16 12:38 AM  Result Value Ref Range   WBC 11.4 (H) 4.0 - 10.5 K/uL   RBC 4.58 3.87 - 5.11 MIL/uL   Hemoglobin 11.5 (L) 12.0 - 15.0 g/dL   HCT 78.234.8 (L) 95.636.0 - 21.346.0 %   MCV 76.0 (L) 78.0 - 100.0 fL   MCH 25.1 (L) 26.0 - 34.0 pg   MCHC 33.0 30.0 - 36.0 g/dL   RDW 08.618.7 (H) 57.811.5 - 46.915.5 %   Platelets 294 150 - 400 K/uL    MDM FHT 162 by doppler U/a >80 ketones CBC, CMP Promethazine 25 mg IV in bag of D5LR & famotidine 20 mg Pt not observed vomiting in MAU & reports improvement in symptoms; able to keep down po fluids S/w Dr. Estanislado Pandyivard -- ok to discharge home. Will discuss schedule of medications with patient  Assessment and Plan  A: 1. Nausea and vomiting during pregnancy prior to [redacted] weeks gestation   2. Dehydration    P: Discharge home Increase dosing of diclegis as needed; pt only taking nightly  Continue promethazine as needed -- can place PV or PR if unable to tolerate PO Call after hours line as needed Keep f/u appt Discussed reasons to return to MAU  Judeth HornErin Jessieca Rhem 09/21/2016, 12:43 AM

## 2016-09-21 NOTE — MAU Note (Signed)
Unable to keep down anything for 4 days. Was just trying to deal with it. Have promethazine and diclegis at home. Some white/yellow vag d/c

## 2016-09-22 LAB — CULTURE, OB URINE: CULTURE: NO GROWTH

## 2016-09-30 NOTE — L&D Delivery Note (Signed)
Delivery Note On 04/02/2017 At 4:44 AM a  viable female was delivered via Vaginal, Spontaneous Delivery (Presentation: ;  ).  APGAR: 9, 9; weight 6 lb 14.4 oz (3130 g).   Placenta status: per CNM. Cord per CNM.   Anesthesia:  Local, lidocaine Episiotomy: None Lacerations: 3rd degree;Vaginal Suture Repair: 2.0 3.0 vicryl Est. Blood Loss (mL): 250  Mom to postpartum.  Baby to Couplet care / Skin to Skin.  Deliviery was performed by Kela MillinNM Nancy Prothero.  I was called in to help with 3rd degree laceration repair.  On exam less than 50% of external anal sphincter torn.  The torn edges were re approximated with four 2-0 vicryl sutures at 4 positions and tied down.  The rest of the laceration was then repaired as per usual 2nd degree laceration repair.  The rectum was checked and noted to the intact.  Patient tolerated the procedure moderately well under local anesthesia.  Postpartum care and need to keep stool soft discussed.    Konrad FelixKULWA,Javis Abboud WAKURU, MD.   04/14/2017, 3:59 PM

## 2016-11-08 ENCOUNTER — Inpatient Hospital Stay (HOSPITAL_COMMUNITY)
Admission: AD | Admit: 2016-11-08 | Discharge: 2016-11-08 | Disposition: A | Discharge status: Home / Self Care | Payer: Managed Care, Other (non HMO) | Source: Ambulatory Visit | Attending: Obstetrics and Gynecology | Admitting: Obstetrics and Gynecology

## 2016-11-08 ENCOUNTER — Encounter (HOSPITAL_COMMUNITY): Payer: Self-pay

## 2016-11-08 DIAGNOSIS — O210 Mild hyperemesis gravidarum: Secondary | ICD-10-CM | POA: Insufficient documentation

## 2016-11-08 DIAGNOSIS — Z3A19 19 weeks gestation of pregnancy: Secondary | ICD-10-CM | POA: Diagnosis not present

## 2016-11-08 MED ORDER — LACTATED RINGERS IV BOLUS (SEPSIS)
1000.0000 mL | Freq: Once | INTRAVENOUS | Status: AC
  Administered 2016-11-08: 1000 mL via INTRAVENOUS

## 2016-11-08 MED ORDER — ONDANSETRON 8 MG PO TBDP: 8.0000 mg | ORAL_TABLET | Freq: Three times a day (TID) | ORAL | 0 refills | Status: DC | PRN

## 2016-11-08 MED ORDER — LACTATED RINGERS IV BOLUS (SEPSIS): 500.0000 mL | Freq: Once | INTRAVENOUS | Status: DC

## 2016-11-08 MED ORDER — SODIUM CHLORIDE 0.9 % IV SOLN
8.0000 mg | Freq: Once | INTRAVENOUS | Status: AC
  Administered 2016-11-08: 8 mg via INTRAVENOUS
  Filled 2016-11-08: qty 4

## 2016-11-08 MED ORDER — M.V.I. ADULT IV INJ
INJECTION | Freq: Once | INTRAVENOUS | Status: AC
  Administered 2016-11-08: 15:00:00 via INTRAVENOUS
  Filled 2016-11-08: qty 1000

## 2016-11-08 NOTE — Discharge Instructions (Signed)
° °Hyperemesis Gravidarum °Hyperemesis gravidarum is a severe form of nausea and vomiting that happens during pregnancy. Hyperemesis is worse than morning sickness. It may cause you to have nausea or vomiting all day for many days. It may keep you from eating and drinking enough food and liquids. Hyperemesis usually occurs during the first half (the first 20 weeks) of pregnancy. It often goes away once a woman is in her second half of pregnancy. However, sometimes hyperemesis continues through an entire pregnancy. °What are the causes? °The cause of this condition is not known. It may be related to changes in chemicals (hormones) in the body during pregnancy, such as the high level of pregnancy hormone (human chorionic gonadotropin) or the increase in the female sex hormone (estrogen). °What are the signs or symptoms? °Symptoms of this condition include: °· Severe nausea and vomiting. °· Nausea that does not go away. °· Vomiting that does not allow you to keep any food down. °· Weight loss. °· Body fluid loss (dehydration). °· Having no desire to eat, or not liking food that you have previously enjoyed. °How is this diagnosed? °This condition may be diagnosed based on: °· A physical exam. °· Your medical history. °· Your symptoms. °· Blood tests. °· Urine tests. °How is this treated? °This condition may be managed with medicine. If medicines to do not help relieve nausea and vomiting, you may need to receive fluids through an IV tube at the hospital. °Follow these instructions at home: °· Take over-the-counter and prescription medicines only as told by your health care provider. °· Avoid iron pills and multivitamins that contain iron for the first 3-4 months of pregnancy. If you take prescription iron pills, do not stop taking them unless your health care provider approves. °· Take the following actions to help prevent nausea and vomiting: °¨ In the morning, before getting out of bed, try eating a couple of dry  crackers or a piece of toast. °¨ Avoid foods and smells that upset your stomach. Fatty and spicy foods may make nausea worse. °¨ Eat 5-6 small meals a day. °¨ Do not drink fluids while eating meals. Drink between meals. °¨ Eat or suck on things that have ginger in them. Ginger can help relieve nausea. °¨ Avoid food preparation. The smell of food can spoil your appetite or trigger nausea. °· Follow instructions from your health care provider about eating or drinking restrictions. °· For snacks, eat high-protein foods, such as cheese. °· Keep all follow-up and pre-birth (prenatal) visits as told by your health care provider. This is important. °Contact a health care provider if: °· You have pain in your abdomen. °· You have a severe headache. °· You have vision problems. °· You are losing weight. °Get help right away if: °· You cannot drink fluids without vomiting. °· You vomit blood. °· You have constant nausea and vomiting. °· You are very weak. °· You are very thirsty. °· You feel dizzy. °· You faint. °· You have a fever or other symptoms that last for more than 2-3 days. °· You have a fever and your symptoms suddenly get worse. °Summary °· Hyperemesis gravidarum is a severe form of nausea and vomiting that happens during pregnancy. °· Making some changes to your eating habits may help relieve nausea and vomiting. °· This condition may be managed with medicine. °· If medicines to do not help relieve nausea and vomiting, you may need to receive fluids through an IV tube at the hospital. °This information   is not intended to replace advice given to you by your health care provider. Make sure you discuss any questions you have with your health care provider. °Document Released: 09/16/2005 Document Revised: 05/15/2016 Document Reviewed: 05/15/2016 °Elsevier Interactive Patient Education © 2017 [employer] ° ° °Eating Plan for Hyperemesis Gravidarum °Hyperemesis gravidarum is a severe form of morning sickness. Because  this condition causes severe nausea and vomiting, it can lead to dehydration, malnutrition, and weight loss. One way to lessen the symptoms of nausea and vomiting is to follow the eating plan for hyperemesis gravidarum. It is often used along with prescribed medicines to control your symptoms. °What can I do to relieve my symptoms? °Listen to your body. Everyone is different and has different preferences. Find what works best for you. Take any of the following actions that are helpful to you: °· Eat and drink slowly. °· Eat 5-6 small meals daily instead of 3 large meals. °· Eat crackers before you get out of bed in the morning. °· Try having a snack in the middle of the night. °· Starchy foods are usually tolerated well. Examples include cereal, toast, bread, potatoes, pasta, rice, and pretzels. °· Ginger may help with nausea. Add ¼ tsp ground ginger to hot tea or choose ginger tea. °· Try drinking 100% fruit juice or an electrolyte drink. An electrolyte drink contains sodium, potassium, and chloride. °· Continue to take your prenatal vitamins as told by your health care provider. If you are having trouble taking your prenatal vitamins, talk with your health care provider about different options. °· Include at least 1 serving of protein with your meals and snacks. Protein options include meats or poultry, beans, nuts, eggs, and yogurt. Try eating a protein-rich snack before bed. Examples of these snacks include cheese and crackers or half of a peanut butter or turkey sandwich. °· Consider eliminating foods that trigger your symptoms. These may include spicy foods, coffee, high-fat foods, very sweet foods, and acidic foods. °· Try meals that have more protein combined with bland, salty, lower-fat, and dry foods, such as nuts, seeds, pretzels, crackers, and cereal. °· Talk with your healthcare provider about starting a supplement of vitamin B6. °· Have fluids that are cold, clear, and carbonated or sour. Examples  include lemonade, ginger ale, lemon-lime soda, ice water, and sparkling water. °· Try lemon or mint tea. °· Try brushing your teeth or using a mouth rinse after meals. °What should I avoid to reduce my symptoms? °Avoiding some of the following things may help reduce your symptoms. °· Foods with strong smells. Try eating meals in well-ventilated areas that are free of odors. °· Drinking water or other beverages with meals. Try not to drink anything during the 30 minutes before and after your meals. °· Drinking more than 1 cup of fluid at a time. Sometimes using a straw helps. °· Fried or high-fat foods, such as butter and cream sauces. °· Spicy foods. °· Skipping meals as best as you can. Nausea can be more intense on an empty stomach. If you cannot tolerate food at that time, do not force it. Try sucking on ice chips or other frozen items, and make up for missed calories later. °· Lying down within 2 hours after eating. °· Environmental triggers. These may include smoky rooms, closed spaces, rooms with strong smells, warm or humid places, overly loud and noisy rooms, and rooms with motion or flickering lights. °· Quick and sudden changes in your movement. °This information is not intended   to replace advice given to you by your health care provider. Make sure you discuss any questions you have with your health care provider. °Document Released: 07/14/2007 Document Revised: 05/15/2016 Document Reviewed: 04/16/2016 °Elsevier Interactive Patient Education © 2017 [employer] ° ° °

## 2016-11-08 NOTE — MAU Provider Note (Signed)
History     CSN: [id number]  Arrival date and time: 11/08/16 1203   First Provider Initiated Contact with Patient 11/08/16 1259      Chief Complaint  Patient presents with  . Hyperemesis Gravidarum   G3P0020 @19 .4 weeks here with HEG. She reports vomiting most days since early pregnancy. She was seen in office today and down 4 lbs since last visit. She has not eaten today. No emesis today. At times is able to tolerate po food and fluids. She used Diclegis initially but it caused vomiting and then she switched to Phenergan. She is unable to tolerate Phenergan often d/t sedating effect. Pregnancy has been complicated only by HEG.    OB History    Gravida Para Term Preterm AB Living   3 0 0 0 2 0   SAB TAB Ectopic Multiple Live Births   0 2 0 0 0      Past Medical History:  Diagnosis Date  . Medical history non-contributory     Past Surgical History:  Procedure Laterality Date  . NO PAST SURGERIES      Family History  Problem Relation Age of Onset  . Migraines Mother   . Hypertension Father     Social History  Substance Use Topics  . Smoking status: Never Smoker  . Smokeless tobacco: Never Used  . Alcohol use Yes     Comment: occaisional    Allergies: No Known Allergies  Prescriptions Prior to Admission  Medication Sig Dispense Refill Last Dose  . Doxylamine-Pyridoxine (DICLEGIS) 10-10 MG TBEC Take 1 tablet by mouth at bedtime.   09/20/2016 at Unknown time  . Prenatal Vit-Fe Fumarate-FA (PRENATAL MULTIVITAMIN) TABS tablet Take 1 tablet by mouth daily at 12 noon.   09/20/2016 at Unknown time  . promethazine (PHENERGAN) 25 MG tablet Take 1 tablet (25 mg total) by mouth every 6 (six) hours as needed for nausea or vomiting. 30 tablet 0 09/20/2016 at Unknown time    Review of Systems  Constitutional: Negative for fever.  Gastrointestinal: Positive for constipation, nausea and vomiting. Negative for abdominal pain and diarrhea.  Genitourinary: Negative for vaginal  bleeding.   Physical Exam   Blood pressure 123/67, pulse 80, temperature 98.1 F (36.7 C), temperature source Oral, resp. rate 18, height 5' 5.5" (1.664 m), weight 63 kg (139 lb), last menstrual period 06/24/2016.  Physical Exam  Nursing note and vitals reviewed. Constitutional: She is oriented to person, place, and time. She appears well-developed and well-nourished. No distress.  HENT:  Head: Normocephalic and atraumatic.  Neck: Normal range of motion.  Cardiovascular: Normal rate.   Respiratory: Effort normal.  Musculoskeletal: Normal range of motion.  Neurological: She is alert and oriented to person, place, and time.  Skin: Skin is warm and dry.  Psychiatric: She has a normal mood and affect.  FHT: 157 bpm MAU Course  Procedures LR 1L MTV 1 L Zofran 8 mg IV  MDM Tolerating po after Zofran (burger and fries), nausea resolved. Presentation, clinical findings, and plan discussed with Dr. Su Hilt. Stable for discharge home.  Assessment and Plan   1. [redacted] weeks gestation of pregnancy   2. Hyperemesis gravidarum    Discharge home Follow up in office in 4 weeks HEG diet Rx Zofran Colace OTC-if not helping may use Miralax daily  Allergies as of 11/08/2016   No Known Allergies     Medication List    STOP taking these medications   promethazine 25 MG tablet Commonly known as:  PHENERGAN     TAKE these medications   acetaminophen 500 MG tablet Commonly known as:  TYLENOL Take 750 mg by mouth every 6 (six) hours as needed for headache.   ondansetron 8 MG disintegrating tablet Commonly known as:  ZOFRAN ODT Take 1 tablet (8 mg total) by mouth every 8 (eight) hours as needed for nausea or vomiting.      Donette LarryMelanie Caedon Bond, CNM 11/08/2016, 12:59 PM

## 2016-11-08 NOTE — MAU Note (Signed)
Pt sent to MAU from MD office, has had ongoing issue with vomiting, has lost 4 lbs from last visit, sent here for IVF.  C/O HA, denies abdominal pain, bleeding, or diarrhea.

## 2016-11-08 NOTE — Progress Notes (Addendum)
Pt sent from office for N/V and weight loss. Orders received for IV insertion and IV bolus fluid, zofran Piggyback, and premixed Vitamin infusion.   1230: IV insertion attempted x2. Unable to obtain. Another nurse tried x2 and still not able to obtain. Anesthesia notified.   Crackers and drink provided.   1240: anesthesia in unit. Stayed 10 mins in unit and requesting house coverage to attempt IV insertion. House coverage at bs. Attempted x2 and unable to obtain. Anesthesia called again. Pt tolerated crackers and drink  1257: anesthesia at bs. Iv inserted in Ac.   1500: pt eating meal and tolerating it well.  1530: Pt ate 100% of meal consisting of hamburger and french fries and able to keep food down. Up to bathroom.  1617: up to bathroom again. IV fluids completed. Pt states feeling well and has kept food she consumed hr ago in.   Discharge instructions given with pt understanding. Pt lift unit via ambulatory.

## 2016-12-20 ENCOUNTER — Encounter: Payer: Self-pay | Admitting: Neurology

## 2016-12-20 ENCOUNTER — Ambulatory Visit (INDEPENDENT_AMBULATORY_CARE_PROVIDER_SITE_OTHER): Payer: 59 | Admitting: Neurology

## 2016-12-20 VITALS — BP 119/77 | HR 78 | Ht 65.5 in | Wt 142.2 lb

## 2016-12-20 DIAGNOSIS — H539 Unspecified visual disturbance: Secondary | ICD-10-CM

## 2016-12-20 DIAGNOSIS — R51 Headache with orthostatic component, not elsewhere classified: Secondary | ICD-10-CM

## 2016-12-20 DIAGNOSIS — R519 Headache, unspecified: Secondary | ICD-10-CM

## 2016-12-20 DIAGNOSIS — O26892 Other specified pregnancy related conditions, second trimester: Secondary | ICD-10-CM

## 2016-12-20 MED ORDER — CYCLOBENZAPRINE HCL 10 MG PO TABS: ORAL_TABLET | ORAL | 6 refills | Status: DC

## 2016-12-20 NOTE — Patient Instructions (Addendum)
Remember to drink plenty of fluid, eat healthy meals and do not skip any meals. Try to eat protein with a every meal and eat a healthy snack such as fruit or nuts in between meals. Try to keep a regular sleep-wake schedule and try to exercise daily, particularly in the form of walking, 20-30 minutes a day, if you can.   As far as your medications are concerned, I would like to suggest: Flexerila nd Zofran as needed for headache.   As far as diagnostic testing: Imaging of the brain  I would like to see you back in 6 weeks, sooner if we need to. Please call us with any interim questions, concerns, problems, updates or refill requests.   Our phone number is [phone]. We also have an after hours call service for urgent matters and there is a physician on-call for urgent questions. For any emergencies you know to call 911 or go to the nearest emergency room  There are limited treatments in pregnancy and all have risks.  Tylenol is generally accepted as safe in pregnancy Other interventions to try and decreased the use of medication for headache/migraine include:   Cool Compress. Lie down and place a cool compress on your head.  Avoid headache triggers. If certain foods or odors seem to have triggered your migraines in the past, avoid them. A headache diary might help you identify triggers.  Include physical activity in your daily routine. Try a daily walk or other moderate aerobic exercise.  Manage stress. Find healthy ways to cope with the stressors, such as delegating tasks on your to-do list.  Practice relaxation techniques. Try deep breathing, yoga, massage and visualization.  Eat regularly. Eating regularly scheduled meals and maintaining a healthy diet might help prevent headaches. Also, drink plenty of fluids.  Follow a regular sleep schedule. Sleep deprivation might contribute to headaches  Consider biofeedback. With this mind-body technique, you learn to control certain bodily functions -  such as muscle tension, heart rate and blood pressure - to prevent headaches or reduce headache pain.   Headaches during pregnancy are common. However, if you develop a severe headache or a headache that doesn't go away, call your health care provider. Severe headaches can be a sign of a pregnancy complication   Cyclobenzaprine tablets What is this medicine? CYCLOBENZAPRINE (sye kloe BEN za preen) is a muscle relaxer. It is used to treat muscle pain, spasms, and stiffness. This medicine may be used for other purposes; ask your health care provider or pharmacist if you have questions. COMMON BRAND NAME(S): Fexmid, Flexeril What should I tell my health care provider before I take this medicine? They need to know if you have any of these conditions: -heart disease, irregular heartbeat, or previous heart attack -liver disease -thyroid problem -an unusual or allergic reaction to cyclobenzaprine, tricyclic antidepressants, lactose, other medicines, foods, dyes, or preservatives -pregnant or trying to get pregnant -breast-feeding How should I use this medicine? Take this medicine by mouth with a glass of water. Follow the directions on the prescription label. If this medicine upsets your stomach, take it with food or milk. Take your medicine at regular intervals. Do not take it more often than directed. Talk to your pediatrician regarding the use of this medicine in children. Special care may be needed. Overdosage: If you think you have taken too much of this medicine contact a poison control center or emergency room at once. NOTE: This medicine is only for you. Do not share this medicine with  others. What if I miss a dose? If you miss a dose, take it as soon as you can. If it is almost time for your next dose, take only that dose. Do not take double or extra doses. What may interact with this medicine? Do not take this medicine with any of the following medications: -certain medicines for fungal  infections like fluconazole, itraconazole, ketoconazole, posaconazole, voriconazole -cisapride -dofetilide -dronedarone -halofantrine -levomethadyl -MAOIs like Carbex, Eldepryl, Marplan, Nardil, and Parnate -narcotic medicines for cough -pimozide -thioridazine -ziprasidone This medicine may also interact with the following medications: -alcohol -antihistamines for allergy, cough and cold -certain medicines for anxiety or sleep -certain medicines for cancer -certain medicines for depression like amitriptyline, fluoxetine, sertraline -certain medicines for infection like alfuzosin, chloroquine, clarithromycin, levofloxacin, mefloquine, pentamidine, troleandomycin -certain medicines for irregular heart beat -certain medicines for seizures like phenobarbital, primidone -contrast dyes -general anesthetics like halothane, isoflurane, methoxyflurane, propofol -local anesthetics like lidocaine, pramoxine, tetracaine -medicines that relax muscles for surgery -narcotic medicines for pain -other medicines that prolong the QT interval (cause an abnormal heart rhythm) -phenothiazines like chlorpromazine, mesoridazine, prochlorperazine This list may not describe all possible interactions. Give your health care provider a list of all the medicines, herbs, non-prescription drugs, or dietary supplements you use. Also tell them if you smoke, drink alcohol, or use illegal drugs. Some items may interact with your medicine. What should I watch for while using this medicine? Tell your doctor or health care professional if your symptoms do not start to get better or if they get worse. You may get drowsy or dizzy. Do not drive, use machinery, or do anything that needs mental alertness until you know how this medicine affects you. Do not stand or sit up quickly, especially if you are an older patient. This reduces the risk of dizzy or fainting spells. Alcohol may interfere with the effect of this medicine.  Avoid alcoholic drinks. If you are taking another medicine that also causes drowsiness, you may have more side effects. Give your health care provider a list of all medicines you use. Your doctor will tell you how much medicine to take. Do not take more medicine than directed. Call emergency for help if you have problems breathing or unusual sleepiness. Your mouth may get dry. Chewing sugarless gum or sucking hard candy, and drinking plenty of water may help. Contact your doctor if the problem does not go away or is severe. What side effects may I notice from receiving this medicine? Side effects that you should report to your doctor or health care professional as soon as possible: -allergic reactions like skin rash, itching or hives, swelling of the face, lips, or tongue -breathing problems -chest pain -fast, irregular heartbeat -hallucinations -seizures -unusually weak or tired Side effects that usually do not require medical attention (report to your doctor or health care professional if they continue or are bothersome): -headache -nausea, vomiting This list may not describe all possible side effects. Call your doctor for medical advice about side effects. You may report side effects to FDA at 1-800-FDA-1088. Where should I keep my medicine? Keep out of the reach of children. Store at room temperature between 15 and 30 degrees C (59 and 86 degrees F). Keep container tightly closed. Throw away any unused medicine after the expiration date. NOTE: This sheet is a summary. It may not cover all possible information. If you have questions about this medicine, talk to your doctor, pharmacist, or health care provider.  2018 Elsevier/Gold Standard (2015-06-27 12:05:46)

## 2016-12-20 NOTE — Progress Notes (Signed)
[facility]    Provider:  Dr Lucia GaskinsAhern Referring Provider: Milus Heightedmon, Noelle, PA-C Primary Care Physician:  REDMON,NOELLE, PA-C  CC:  Headaches  HPI:  Bonnie Erickson is a 26 y.o. female here as a referral from Dr. Sherlyn Lickedmon for headaches. Past medical history of leukocytosis, anemia. Started at the beginning of pregnancy 7-8 weeks. She is currently [redacted] weeks pregnant. Severe for 2 months and has slightly improved since then. She having daily headaches. She is [redacted] weeks pregnant. Most of the time in the frontal area. They can be more left-sided. Mother with migraines but patient does not have a history of migraines or headaches. Headaches are aching. Some light sensitivity but doesn't need a dark room,no nausea or vomiting. No known triggers. She can wake up with the headaches. Tylenol didn't help. Nothing helps. If she sleeps she can wake up with it. She wakes with the headaches more than 1/2 the month. No snoring. She sleeps on her side. She has blurry vision with the headaches. She can get dizzy. No weakness. No hearing changes. No speech or cognition changes. No other focal neurologic deficits, associated symptoms, inciting events or modifiable factors.  Review of Systems: Patient complains of symptoms per HPI as well as the following symptoms: No fever, no meningismus, no CP, no sob. Pertinent negatives per HPI. All others negative.   Social History   Social History  . Marital status: Single    Spouse name: N/A  . Number of children: 0  . Years of education: 6916   Occupational History  . [employer]    Social History Main Topics  . Smoking status: Never Smoker  . Smokeless tobacco: Never Used  . Alcohol use Yes     Comment: occaisional  . Drug use: No     Comment: Rare use in the past  . Sexual activity: Yes    Partners: Male    Birth control/ protection: Other-see comments     Comment: Pregnant   Other Topics Concern  . Not on file   Social History  Narrative   Lives at home w/ her best friend   Right-handed   Caffeine: rare    Family History  Problem Relation Age of Onset  . Migraines Mother   . Hypertension Mother   . Anemia Mother   . Hypertension Father   . High blood pressure Maternal Grandmother   . Diabetes Maternal Grandmother     Past Medical History:  Diagnosis Date  . Anemia   . H/O menorrhagia   . Leukocytosis     Past Surgical History:  Procedure Laterality Date  . NO PAST SURGERIES      Current Outpatient Prescriptions  Medication Sig Dispense Refill  . promethazine (PHENERGAN) 25 MG tablet Take 25 mg by mouth every 8 (eight) hours as needed for nausea or vomiting.    Marland Kitchen. acetaminophen (TYLENOL) 500 MG tablet Take 750 mg by mouth every 6 (six) hours as needed for headache.    . cyclobenzaprine (FLEXERIL) 10 MG tablet Take 1 tablet (10 mg total) by mouth 3 (three) times daily as needed for headache. 90 tablet 6  . ondansetron (ZOFRAN ODT) 8 MG disintegrating tablet Take 1 tablet (8 mg total) by mouth every 8 (eight) hours as needed for nausea or vomiting. (Patient not taking: Reported on 12/20/2016) 30 tablet 0   No current facility-administered medications for this visit.     Allergies as of 12/20/2016  . (No Known Allergies)    Vitals:  BP 119/77   Pulse 78   Ht 5' 5.5" (1.664 m)   Wt 142 lb 3.2 oz (64.5 kg)   LMP 06/24/2016   BMI 23.30 kg/m  Last Weight:  Wt Readings from Last 1 Encounters:  12/20/16 142 lb 3.2 oz (64.5 kg)   Last Height:   Ht Readings from Last 1 Encounters:  12/20/16 5' 5.5" (1.664 m)   Physical exam: Exam: Gen: NAD, conversant, well nourised, well groomed                     CV: RRR, no MRG. No Carotid Bruits. No peripheral edema, warm, nontender Eyes: Conjunctivae clear without exudates or hemorrhage  Neuro: Detailed Neurologic Exam  Speech:    Speech is normal; fluent and spontaneous with normal comprehension.  Cognition:    The patient is oriented to person,  place, and time;     recent and remote memory intact;     language fluent;     normal attention, concentration,     fund of knowledge Cranial Nerves:    The pupils are equal, round, and reactive to light. The fundi are normal and spontaneous venous pulsations are present. Visual fields are full to finger confrontation. Extraocular movements are intact. Trigeminal sensation is intact and the muscles of mastication are normal. The face is symmetric. The palate elevates in the midline. Hearing intact. Voice is normal. Shoulder shrug is normal. The tongue has normal motion without fasciculations.   Coordination:    Normal finger to nose and heel to shin. Normal rapid alternating movements.   Gait:    Heel-toe and tandem gait are normal.   Motor Observation:    No asymmetry, no atrophy, and no involuntary movements noted. Tone:    Normal muscle tone.    Posture:    Posture is normal. normal erect    Strength:    Strength is V/V in the upper and lower limbs.      Sensation: intact to LT     Reflex Exam:  DTR's:    Deep tendon reflexes in the upper and lower extremities are normal bilaterally.   Toes:    The toes are downgoing bilaterally.   Clonus:    Clonus is absent.    Assessment/Plan:  26 year old with new onset headaches in pregnancy. Pregnancy headaches are common due to hormones, changes in fluid status, stress and anxiety. However need to further evaluate due to vision changes and positional quality (wakes with them) to ensure no other etiology such as cerebral venous thrombosis or stroke as pregnancy increases risk of these.  - Discussed preeclampsia, no signs needs to follow very closely with obgyn - MRI brain and MRV head - Suggested flexeril, please clear with obygyn first, discussed risks of medication use in regnancy - Discussed: There are limited treatments in pregnancy and all have risks.  Tylenol is generally accepted as safe in pregnancy Other interventions to  try and decreased the use of medication for headache/migraine include:   Cool Compress. Lie down and place a cool compress on your head.  Avoid headache triggers. If certain foods or odors seem to have triggered your migraines in the past, avoid them. A headache diary might help you identify triggers.  Include physical activity in your daily routine. Try a daily walk or other moderate aerobic exercise.  Manage stress. Find healthy ways to cope with the stressors, such as delegating tasks on your to-do list.  Practice relaxation techniques. Try deep  breathing, yoga, massage and visualization.  Eat regularly. Eating regularly scheduled meals and maintaining a healthy diet might help prevent headaches. Also, drink plenty of fluids.  Follow a regular sleep schedule. Sleep deprivation might contribute to headaches  Consider biofeedback. With this mind-body technique, you learn to control certain bodily functions - such as muscle tension, heart rate and blood pressure - to prevent headaches or reduce headache pain.   Headaches during pregnancy are common. However, if you develop a severe headache or a headache that doesn't go away, call your health care provider. Severe headaches can be a sign of a pregnancy complication   Orders Placed This Encounter  Procedures  . MR BRAIN WO CONTRAST  . MR MRV HEAD WO CM   Cc: REDMON,NOELLE, PA-C  Naomie Dean, MD  [facility] [address] [place], [place] [address]  Phone [phone] Fax [phone]  A total of 60 minutes was spent in with this patient face-to-face. Over half this time was spent on counseling patient on the pregnancy headache diagnosis and different therapeutic options available.

## 2016-12-22 DIAGNOSIS — R51 Headache: Secondary | ICD-10-CM

## 2016-12-22 DIAGNOSIS — O26899 Other specified pregnancy related conditions, unspecified trimester: Secondary | ICD-10-CM | POA: Insufficient documentation

## 2017-01-10 ENCOUNTER — Other Ambulatory Visit: Payer: Managed Care, Other (non HMO)

## 2017-01-10 ENCOUNTER — Inpatient Hospital Stay: Admission: RE | Admit: 2017-01-10 | Payer: Managed Care, Other (non HMO) | Source: Ambulatory Visit

## 2017-02-13 ENCOUNTER — Ambulatory Visit: Payer: 59 | Admitting: Adult Health

## 2017-03-04 LAB — OB RESULTS CONSOLE GBS: STREP GROUP B AG: NEGATIVE

## 2017-04-01 ENCOUNTER — Encounter (HOSPITAL_COMMUNITY): Payer: Self-pay

## 2017-04-01 ENCOUNTER — Inpatient Hospital Stay (HOSPITAL_COMMUNITY)
Admission: AD | Admit: 2017-04-01 | Discharge: 2017-04-04 | DRG: 775 | Disposition: A | Discharge status: Home / Self Care | Payer: Managed Care, Other (non HMO) | Source: Ambulatory Visit | Attending: Obstetrics & Gynecology | Admitting: Obstetrics & Gynecology

## 2017-04-01 DIAGNOSIS — Z3A40 40 weeks gestation of pregnancy: Secondary | ICD-10-CM

## 2017-04-01 DIAGNOSIS — O9902 Anemia complicating childbirth: Secondary | ICD-10-CM | POA: Diagnosis present

## 2017-04-01 DIAGNOSIS — D649 Anemia, unspecified: Secondary | ICD-10-CM | POA: Diagnosis present

## 2017-04-01 DIAGNOSIS — Z3493 Encounter for supervision of normal pregnancy, unspecified, third trimester: Secondary | ICD-10-CM | POA: Diagnosis present

## 2017-04-01 LAB — CBC
HCT: 33.7 % — ABNORMAL LOW (ref 36.0–46.0)
Hemoglobin: 10.7 g/dL — ABNORMAL LOW (ref 12.0–15.0)
MCH: 25 pg — ABNORMAL LOW (ref 26.0–34.0)
MCHC: 31.8 g/dL (ref 30.0–36.0)
MCV: 78.7 fL (ref 78.0–100.0)
PLATELETS: 243 10*3/uL (ref 150–400)
RBC: 4.28 MIL/uL (ref 3.87–5.11)
RDW: 17.3 % — AB (ref 11.5–15.5)
WBC: 12.9 10*3/uL — AB (ref 4.0–10.5)

## 2017-04-01 LAB — TYPE AND SCREEN
ABO/RH(D): B POS
ANTIBODY SCREEN: NEGATIVE

## 2017-04-01 MED ORDER — OXYCODONE-ACETAMINOPHEN 5-325 MG PO TABS: 2.0000 | ORAL_TABLET | ORAL | Status: DC | PRN

## 2017-04-01 MED ORDER — EPHEDRINE 5 MG/ML INJ: 10.0000 mg | INTRAVENOUS | Status: DC | PRN

## 2017-04-01 MED ORDER — LACTATED RINGERS IV SOLN: 500.0000 mL | Freq: Once | INTRAVENOUS | Status: DC

## 2017-04-01 MED ORDER — BUTORPHANOL TARTRATE 1 MG/ML IJ SOLN
1.0000 mg | INTRAMUSCULAR | Status: DC | PRN
  Administered 2017-04-01 – 2017-04-02 (×5): 1 mg via INTRAVENOUS
  Filled 2017-04-01 (×5): qty 1

## 2017-04-01 MED ORDER — DIPHENHYDRAMINE HCL 50 MG/ML IJ SOLN: 12.5000 mg | INTRAMUSCULAR | Status: DC | PRN

## 2017-04-01 MED ORDER — PHENYLEPHRINE 40 MCG/ML (10ML) SYRINGE FOR IV PUSH (FOR BLOOD PRESSURE SUPPORT): 80.0000 ug | PREFILLED_SYRINGE | INTRAVENOUS | Status: DC | PRN

## 2017-04-01 MED ORDER — ACETAMINOPHEN 325 MG PO TABS: 650.0000 mg | ORAL_TABLET | ORAL | Status: DC | PRN

## 2017-04-01 MED ORDER — LIDOCAINE HCL (PF) 1 % IJ SOLN
30.0000 mL | INTRAMUSCULAR | Status: AC | PRN
  Administered 2017-04-02: 30 mL via SUBCUTANEOUS
  Filled 2017-04-01: qty 30

## 2017-04-01 MED ORDER — SOD CITRATE-CITRIC ACID 500-334 MG/5ML PO SOLN: 30.0000 mL | ORAL | Status: DC | PRN

## 2017-04-01 MED ORDER — OXYTOCIN 40 UNITS IN LACTATED RINGERS INFUSION - SIMPLE MED
2.5000 [IU]/h | INTRAVENOUS | Status: DC
  Filled 2017-04-01: qty 1000

## 2017-04-01 MED ORDER — FENTANYL 2.5 MCG/ML BUPIVACAINE 1/10 % EPIDURAL INFUSION (WH - ANES): 14.0000 mL/h | INTRAMUSCULAR | Status: DC | PRN

## 2017-04-01 MED ORDER — OXYCODONE-ACETAMINOPHEN 5-325 MG PO TABS: 1.0000 | ORAL_TABLET | ORAL | Status: DC | PRN

## 2017-04-01 MED ORDER — LACTATED RINGERS IV SOLN
INTRAVENOUS | Status: DC
  Administered 2017-04-01: 125 mL/h via INTRAVENOUS

## 2017-04-01 MED ORDER — LACTATED RINGERS IV SOLN
500.0000 mL | INTRAVENOUS | Status: DC | PRN
Start: 2017-04-01 — End: 2017-04-02

## 2017-04-01 MED ORDER — ONDANSETRON HCL 4 MG/2ML IJ SOLN: 4.0000 mg | Freq: Four times a day (QID) | INTRAMUSCULAR | Status: DC | PRN

## 2017-04-01 MED ORDER — OXYTOCIN BOLUS FROM INFUSION
500.0000 mL | Freq: Once | INTRAVENOUS | Status: AC
  Administered 2017-04-02: 500 mL via INTRAVENOUS

## 2017-04-01 NOTE — MAU Note (Signed)
Pt reports contractions every 5-7 mins. Pt denies LOF, but states she has some bloody mucous. Reports good fetal movement. States she was in the office today and was 4cm.

## 2017-04-01 NOTE — H&P (Signed)
Bonnie Erickson is a 26 y.o. female, G3P0 at 40.1 weeks, presenting for contractions.  FM+  Patient Active Problem List   Diagnosis Date Noted  . Headache in pregnancy 12/22/2016    History of present pregnancy Patient entered care at 7 weeks.   EDC of 03/31/2017 was established by LMP.   Anatomy scan:  19.4 weeks, with normal findings and an anterior placenta.   Additional US evaluations: 23.1 wks complete anatomy screen.   Significant prenatal events:  Headaches Last evaluation: Today  OB History    Gravida Para Term Preterm AB Living   3 0 0 0 2 0   SAB TAB Ectopic Multiple Live Births   0 2 0 0 0     Past Medical History:  Diagnosis Date  . Anemia   . H/O menorrhagia   . Leukocytosis    Past Surgical History:  Procedure Laterality Date  . NO PAST SURGERIES     Family History: family history includes Anemia in her mother; Diabetes in her maternal grandmother; High blood pressure in her maternal grandmother; Hypertension in her father and mother; Migraines in her mother. Social History:  reports that she has never smoked. She has never used smokeless tobacco. She reports that she drinks alcohol. She reports that she does not use drugs.   Prenatal Transfer Tool  Maternal Diabetes: No Genetic Screening: Normal Maternal Ultrasounds/Referrals: Normal Fetal Ultrasounds or other Referrals:  None Maternal Substance Abuse:  No Significant Maternal Medications:  None Significant Maternal Lab Results: Lab values include: Other: low HGB  TDAP Yes Flu No  ROS:  All 10 systems reviewed and negative except as stated above.  No Known Allergies   Dilation: 4.5 Effacement (%): 100 Station: -2 Exam by:: Camelia Enganielle Simpson Blood pressure 126/89, pulse (!) 104, temperature 97.8 F (36.6 C), temperature source Oral, resp. rate 16, height 5' 5.5" (1.664 m), weight 72.1 kg (159 lb), last menstrual period 06/24/2016, SpO2 98 %.  Chest clear Heart RRR without murmur Abd gravid, NT,  FH appropriate Pelvic: adequate per RN Ext: +2/+2  FHR: Category 1 UCs:  3 in 10  Prenatal labs: ABO, Rh: --/--/B POS (12/01 1102) Antibody:  Negative Rubella:  Immune RPR:   NR HBsAg:   Neg HIV: Non Reactive (12/01 1102)  GBS:  Neg Sickle cell/Hgb electrophoresis:  Neg Pap:  2017 wnl GC:  Neg Chlamydia: Neg Genetic screenings:  WNL Glucola:  114 Other:   Hgb 10.8at NOB, 9.3 at 28 weeks       Assessment/Plan: IUP at 40.1 wks in active labor Cat 1 strip Anemia Plan: Admit to Birthing Suite per consult with Sallye OberKulwa Routine [facility] orders Pain med/epidural prn   Henderson NewcomerNancy Jean ProtheroCNM, MSN 04/01/2017, 9:34 PM

## 2017-04-02 ENCOUNTER — Encounter (HOSPITAL_COMMUNITY): Payer: Self-pay | Admitting: *Deleted

## 2017-04-02 LAB — RPR: RPR Ser Ql: NONREACTIVE

## 2017-04-02 MED ORDER — BENZOCAINE-MENTHOL 20-0.5 % EX AERO
1.0000 "application " | INHALATION_SPRAY | CUTANEOUS | Status: DC | PRN
  Filled 2017-04-02: qty 56

## 2017-04-02 MED ORDER — COCONUT OIL OIL
1.0000 "application " | TOPICAL_OIL | Status: DC | PRN
  Administered 2017-04-04: 1 via TOPICAL
  Filled 2017-04-02: qty 120

## 2017-04-02 MED ORDER — ONDANSETRON HCL 4 MG/2ML IJ SOLN: 4.0000 mg | INTRAMUSCULAR | Status: DC | PRN

## 2017-04-02 MED ORDER — OXYCODONE-ACETAMINOPHEN 5-325 MG PO TABS: 1.0000 | ORAL_TABLET | ORAL | Status: DC | PRN

## 2017-04-02 MED ORDER — DIBUCAINE 1 % RE OINT: 1.0000 "application " | TOPICAL_OINTMENT | RECTAL | Status: DC | PRN

## 2017-04-02 MED ORDER — ACETAMINOPHEN 325 MG PO TABS: 650.0000 mg | ORAL_TABLET | ORAL | Status: DC | PRN

## 2017-04-02 MED ORDER — ZOLPIDEM TARTRATE 5 MG PO TABS: 5.0000 mg | ORAL_TABLET | Freq: Every evening | ORAL | Status: DC | PRN

## 2017-04-02 MED ORDER — IBUPROFEN 600 MG PO TABS
600.0000 mg | ORAL_TABLET | Freq: Four times a day (QID) | ORAL | Status: DC
  Administered 2017-04-02 – 2017-04-04 (×9): 600 mg via ORAL
  Filled 2017-04-02 (×9): qty 1

## 2017-04-02 MED ORDER — PRENATAL MULTIVITAMIN CH
1.0000 | ORAL_TABLET | Freq: Every day | ORAL | Status: DC
  Administered 2017-04-03 – 2017-04-04 (×2): 1 via ORAL
  Filled 2017-04-02 (×3): qty 1

## 2017-04-02 MED ORDER — WITCH HAZEL-GLYCERIN EX PADS
1.0000 "application " | MEDICATED_PAD | CUTANEOUS | Status: DC | PRN
  Administered 2017-04-03: 1 via TOPICAL

## 2017-04-02 MED ORDER — TETANUS-DIPHTH-ACELL PERTUSSIS 5-2.5-18.5 LF-MCG/0.5 IM SUSP: 0.5000 mL | Freq: Once | INTRAMUSCULAR | Status: DC

## 2017-04-02 MED ORDER — SIMETHICONE 80 MG PO CHEW: 80.0000 mg | CHEWABLE_TABLET | ORAL | Status: DC | PRN

## 2017-04-02 MED ORDER — SENNOSIDES-DOCUSATE SODIUM 8.6-50 MG PO TABS
2.0000 | ORAL_TABLET | ORAL | Status: DC
  Administered 2017-04-03 (×2): 2 via ORAL
  Filled 2017-04-02 (×2): qty 2

## 2017-04-02 MED ORDER — DIPHENHYDRAMINE HCL 25 MG PO CAPS: 25.0000 mg | ORAL_CAPSULE | Freq: Four times a day (QID) | ORAL | Status: DC | PRN

## 2017-04-02 MED ORDER — ONDANSETRON HCL 4 MG PO TABS: 4.0000 mg | ORAL_TABLET | ORAL | Status: DC | PRN

## 2017-04-02 NOTE — Progress Notes (Signed)
Delivery Note At  a viable female was delivered via  (Presentation:vtx ; OA ).  APGAR:9 ,9 ; weight  .   Placenta status:intact , . 3V Cord:  with the following complications:3rd degree laceration .    Anesthesia:  IV pain medication Episiotomy:  N/A Lacerations:  3rd degree  Dr. Sallye OberKulwa in to repair laceration. Suture Repair: 2.0 3.0 vicryl Est. Blood Loss (mL):150cc    Mom to postpartum.  Baby to Couplet care / Skin to Skin.  Bonnie Erickson 04/02/2017, 6:24 AM

## 2017-04-02 NOTE — Progress Notes (Signed)
Subjective: Pt feeling rectal pressure.  Objective: BP (!) 138/97   Pulse 78   Temp 98.8 F (37.1 C) (Oral)   Resp (!) 22   Ht 5' 5.5" (1.664 m)   Wt 72.1 kg (159 lb)   LMP 06/24/2016   SpO2 98%   BMI 26.06 kg/m  No intake/output data recorded. No intake/output data recorded.  FHT: Category 1 UC:   regular, every 3 minutes  AROM clear fluid SVE:   Dilation: 9 Effacement (%): 100 Station: -1 Exam by:: JDaley Assessment:  Pt is a G3P0020 at 40.1 IUP in active labor Cat 1 strip  Plan: Anticipate SVD  Kenney HousemanNancy Jean Schyler Counsell CNM, MSN 04/02/2017, 2:59 AM

## 2017-04-02 NOTE — Lactation Note (Signed)
This note was copied from a baby's chart. Lactation Consultation Note  Patient Name: Bonnie Erickson BJYNW'GToday's Date: 04/02/2017 Reason for consult: Initial assessment;Other (Comment) (Baby spitty.)  Baby 9 hours old. Mom reports that baby had nursed well earlier, but now keeps spitting. Mom requesting assistance with positioning and latching baby, but baby very spitty when moved from crib to mom's breast. Assisted mom with positioning and latching baby to left breast in football position. However, baby not cueing to nurse. Enc mom to hold baby STS now to assist with drainage of fluids and to help elevate baby's temperature as well. 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 Enc mom to focus on breastfeeding for the first 3-4 weeks of baby's life, and to find out about pumping situation. [facility] enc [facility] for assistance/plan for pumping when mom knows more details.   [facility], aware of OP/BFSG and LC phone line assistance after D/C.   Maternal Data Has patient been taught Hand Expression?: Yes Does the patient have breastfeeding experience prior to this delivery?: No  Feeding Feeding Type: Breast Fed Length of feed: 0 min  LATCH Score/Interventions Latch: Too sleepy or reluctant, no latch achieved, no sucking elicited. Intervention(s): Skin to skin;Teach feeding cues;Waking techniques Intervention(s): Adjust position;Assist with latch;Breast compression  Audible Swallowing: None Intervention(s): Skin to skin;Hand expression  Type of Nipple: Everted at rest and after stimulation (Small, short-shafted nipple.)  Comfort (Breast/Nipple): Soft / non-tender     Hold (Positioning): Assistance needed to correctly position infant at breast and maintain latch. Intervention(s): Breastfeeding basics reviewed;Support Pillows;Position  options;Skin to skin  LATCH Score: 5  Lactation Tools Discussed/Used     Consult Status Consult Status: Follow-up Date: 04/03/17 Follow-up type: In-patient    Bonnie Erickson 04/02/2017, 2:02 PM

## 2017-04-02 NOTE — Progress Notes (Signed)
600 ml urine with I &0

## 2017-04-03 LAB — CBC
HCT: 23.6 % — ABNORMAL LOW (ref 36.0–46.0)
Hemoglobin: 7.7 g/dL — ABNORMAL LOW (ref 12.0–15.0)
MCH: 25.8 pg — ABNORMAL LOW (ref 26.0–34.0)
MCHC: 32.6 g/dL (ref 30.0–36.0)
MCV: 79.2 fL (ref 78.0–100.0)
Platelets: 198 10*3/uL (ref 150–400)
RBC: 2.98 MIL/uL — ABNORMAL LOW (ref 3.87–5.11)
RDW: 17.7 % — ABNORMAL HIGH (ref 11.5–15.5)
WBC: 14.8 10*3/uL — ABNORMAL HIGH (ref 4.0–10.5)

## 2017-04-03 MED ORDER — FERROUS SULFATE 325 (65 FE) MG PO TABS
325.0000 mg | ORAL_TABLET | Freq: Two times a day (BID) | ORAL | Status: DC
  Administered 2017-04-03 – 2017-04-04 (×3): 325 mg via ORAL
  Filled 2017-04-03 (×3): qty 1

## 2017-04-03 NOTE — Progress Notes (Signed)
Casimiro NeedleAaliyah Forst  Post Partum Day 1:S/P SVD with repair of 3rd Degree Laceration  Subjective: Patient up ad lib, denies syncope or dizziness. Reports consuming regular diet without issues and denies N/V. Denies issues with urination and reports bleeding is "getting lighter."  Patient is breastfeeding and reports going good as baby is latching good.  Desires condoms for postpartum contraception.  Pain is being appropriately managed with use of motrin.  Objective: Vitals:   04/02/17 0646 04/02/17 1127 04/02/17 1545 04/03/17 0500  BP: 127/85 121/78 121/70 (!) 104/56  Pulse: 96 87 95 79  Resp:   18 18  Temp:  98.9 F (37.2 C) 98.7 F (37.1 C) 97.6 F (36.4 C)  TempSrc:  Oral Oral Oral  SpO2:   100%   Weight:      Height:        Recent Labs  04/01/17 2140 04/03/17 0517  HGB 10.7* 7.7*  HCT 33.7* 23.6*    Physical Exam:  General: alert, cooperative and no distress Mood/Affect: Appropriate/Bright Lungs: clear to auscultation, no wheezes, rales or rhonchi, symmetric air entry.  Heart: normal rate and regular rhythm. Breast: breasts appear normal, no suspicious masses, no skin or nipple changes or axillary nodes. Abdomen:  + bowel sounds, soft, NT Uterine Fundus: firm, U/-1 Lochia: appropriate Laceration: Healing Well Skin: Warm, Dry DVT Evaluation: No significant calf/ankle edema.  Assessment S/P Vaginal Delivery-Day 1 Normal Involution BreastFeeding Asymptomatic Anemia Female infant  Plan: -Orthostatic Vital Signs -Fe+ supplementation 325mg  BID -CBC in am to reassess -Lactation Consult for guidance on pumping -Plan for outpatient circumcision -Continue current care -Dr. ND to be updated on patient status   Cherre RobinsJessica L Malayshia All, MSN, CNM 04/03/2017, 6:15 AM

## 2017-04-03 NOTE — Lactation Note (Signed)
This note was copied from a baby's chart. Lactation Consultation Note  Baby 6935 hours old and mother states she called Engineering geologistMBU secretary for breastfeeding assistance @ noon today and her RN Linda HedgesStefanie helped her latch. Baby recently bf for 45 min.  Mother wanted LC to show her how to use her personal DEBP. Reviewed use and demonstrated on mother.  Referred her to pg 36 in Baby booklet for milk storage guidelines for when mother goes back to work in October. Mother can easily express breastmilk. Discussed engorgement care and how milk comes to volume. Mother states she needs no further assistance at this time.    Patient Name: Bonnie Erickson XBMWU'XToday's Date: 04/03/2017 Reason for consult: Follow-up assessment   Maternal Data    Feeding Feeding Type: Breast Fed Length of feed: 45 min  LATCH Score/Interventions Latch: Grasps breast easily, tongue down, lips flanged, rhythmical sucking. Intervention(s): Skin to skin Intervention(s): Adjust position;Assist with latch;Breast massage;Breast compression  Audible Swallowing: A few with stimulation  Type of Nipple: Everted at rest and after stimulation  Comfort (Breast/Nipple): Soft / non-tender     Hold (Positioning): Assistance needed to correctly position infant at breast and maintain latch.  LATCH Score: 8  Lactation Tools Discussed/Used     Consult Status Consult Status: Follow-up Date: 04/04/17 Follow-up type: In-patient    Dahlia ByesBerkelhammer, Tenise Stetler [facility] 04/03/2017, 4:19 PM

## 2017-04-03 NOTE — Plan of Care (Signed)
Problem: Education: Goal: Knowledge of condition will improve Provided and explained tucks pads and sitz bath to patient.   Problem: Nutritional: Goal: Mothers verbalization of comfort with breastfeeding process will improve Entered the room while baby latched. Baby close to the end of feeding; however, baby latched well and mother comfortable. Mother stated that baby sometimes latches immediately, but sometimes has trouble, depending on how sleepy he is. Encouraged mother to unwrap baby down to diaper and do skin to skin in order to wake baby more.

## 2017-04-04 NOTE — Discharge Summary (Signed)
OB Discharge Summary     Patient Name: Bonnie Erickson DOB: July 08, 1991 MRN: [id number]  Date of admission: 04/01/2017 Delivering MD: Kenney Houseman   Date of discharge: 04/04/2017  Admitting diagnosis: 40WKS CTX Intrauterine pregnancy: 100w2d     Secondary diagnosis:  Active Problems:   Normal labor   SVD (spontaneous vaginal delivery)   Third degree laceration of perineum, type 3a  Additional problems: Third degree laceration     Discharge diagnosis: Term Pregnancy Delivered                                                                                                Post partum procedures:None  Augmentation: AROM  Complications: None  Hospital course:  Onset of Labor With Vaginal Delivery     26 y.o. yo [id number] at [redacted]w[redacted]d was admitted in Active Labor on 04/01/2017. Patient had an uncomplicated labor course as follows:  Membrane Rupture Time/Date: 2:24 AM ,04/02/2017   Intrapartum Procedures: Episiotomy: None [1]                                         Lacerations:  3rd degree [4];Vaginal [6]  Patient had a delivery of a Viable infant. 04/02/2017  Information for the patient's newborn:  Tirza, Senteno [[id number]]  Delivery Method: Vaginal, Spontaneous Delivery (Filed from Delivery Summary)    Pateint had an uncomplicated postpartum course.  She is ambulating, tolerating a regular diet, passing flatus, and urinating well. Patient is discharged home in stable condition on 04/04/17.   Physical exam  Vitals:   04/02/17 1545 04/03/17 0500 04/03/17 1828 04/04/17 0512  BP: 121/70 (!) 104/56 110/75 107/62  Pulse: 95 79 96 72  Resp: 18 18 18 18   Temp: 98.7 F (37.1 C) 97.6 F (36.4 C) 98.6 F (37 C) 98.5 F (36.9 C)  TempSrc: Oral Oral Oral Oral  SpO2: 100%     Weight:      Height:       General: alert, cooperative and no distress Lochia: appropriate Uterine Fundus: firm Incision: N/A DVT Evaluation: No evidence of DVT seen on physical exam. Negative Homan's  sign. Labs: Lab Results  Component Value Date   WBC 14.8 (H) 04/03/2017   HGB 7.7 (L) 04/03/2017   HCT 23.6 (L) 04/03/2017   MCV 79.2 04/03/2017   PLT 198 04/03/2017   CMP Latest Ref Rng & Units 09/21/2016  Glucose 65 - 99 mg/dL 94  BUN 6 - 20 mg/dL 7  Creatinine 4.03 - 4.74 mg/dL 2.59  Sodium 563 - 875 mmol/L 134(L)  Potassium 3.5 - 5.1 mmol/L 3.5  Chloride 101 - 111 mmol/L 102  CO2 22 - 32 mmol/L 24  Calcium 8.9 - 10.3 mg/dL 9.6  Total Protein 6.5 - 8.1 g/dL 7.6  Total Bilirubin 0.3 - 1.2 mg/dL 0.3  Alkaline Phos 38 - 126 U/L 47  AST 15 - 41 U/L 18  ALT 14 - 54 U/L 15    Discharge instruction: per After Visit Summary and "Baby  and Me Booklet".  After visit meds:  Ibuprofen, Colace, Iron  Diet: routine diet  Activity: Advance as tolerated. Pelvic rest for 6 weeks.   Outpatient follow up:6 weeks Follow up Appt:No future appointments. Follow up Visit:No Follow-up on file.  Postpartum contraception: Condoms  Newborn Data: Live born female  Birth Weight: 6 lb 14.4 oz (3130 g) APGAR: 9, 9  Baby Feeding: Breast Disposition:home with mother   04/04/2017 Kenney HousemanNancy Jean Clinton Dragone, CNM

## 2017-04-04 NOTE — Lactation Note (Signed)
This note was copied from a baby's chart. Lactation Consultation Note  Patient Name: Bonnie Erickson ZOXWR'UToday's Date: 04/04/2017 Reason for consult: Follow-up assessment Assisted mom with football hold.  Baby latched easily and deep.  Encouraged mom to massage and compress breast during feeding.  Reviewed basics and lactation outpatient services and encouraged to call prn.  Maternal Data    Feeding Feeding Type: Breast Fed Length of feed: 15 min  LATCH Score/Interventions Latch: Grasps breast easily, tongue down, lips flanged, rhythmical sucking. Intervention(s): Skin to skin;Teach feeding cues;Waking techniques Intervention(s): Breast compression;Breast massage;Assist with latch;Adjust position  Audible Swallowing: A few with stimulation Intervention(s): Skin to skin;Hand expression;Alternate breast massage  Type of Nipple: Everted at rest and after stimulation  Comfort (Breast/Nipple): Soft / non-tender     Hold (Positioning): Assistance needed to correctly position infant at breast and maintain latch. Intervention(s): Breastfeeding basics reviewed;Support Pillows;Position options;Skin to skin  LATCH Score: 8  Lactation Tools Discussed/Used     Consult Status Consult Status: Complete    Mellisa Arshad S 04/04/2017, 11:06 AM

## 2017-04-04 NOTE — Plan of Care (Signed)
Problem: Education: Goal: Knowledge of condition will improve Discharge education, safety and follow up reviewed with patient. Patient verbalizes understanding.    

## 2017-04-06 ENCOUNTER — Inpatient Hospital Stay (HOSPITAL_COMMUNITY): Admission: RE | Admit: 2017-04-06 | Payer: Managed Care, Other (non HMO) | Source: Ambulatory Visit

## 2017-05-02 ENCOUNTER — Encounter (HOSPITAL_COMMUNITY): Payer: Self-pay | Admitting: *Deleted

## 2017-05-02 ENCOUNTER — Inpatient Hospital Stay (HOSPITAL_COMMUNITY)
Admission: AD | Admit: 2017-05-02 | Discharge: 2017-05-02 | Disposition: A | Discharge status: Home / Self Care | Payer: Managed Care, Other (non HMO) | Source: Ambulatory Visit | Attending: Obstetrics and Gynecology | Admitting: Obstetrics and Gynecology

## 2017-05-02 DIAGNOSIS — K5900 Constipation, unspecified: Secondary | ICD-10-CM

## 2017-05-02 DIAGNOSIS — R102 Pelvic and perineal pain: Secondary | ICD-10-CM

## 2017-05-02 DIAGNOSIS — O9089 Other complications of the puerperium, not elsewhere classified: Secondary | ICD-10-CM | POA: Insufficient documentation

## 2017-05-02 NOTE — MAU Note (Signed)
Chief Complaint: Vaginal Pain (stitches came out)   None    SUBJECTIVE HPI: Bonnie Erickson is a 26 y.o. G3P1021 at Unknown who presents to Maternity Admissions reporting perineal pain and stitches coming out.  Pt had SVD on 04/02/2017 with third degree laceration repaired.  Pt noted the sutures with wiping today.  Using ibuprofen for discomfort.  Currently breastfeeding.  Has not had intercourse.  Having some problems with constipation as well.  Not taking a stool softener.  Happy with baby.  Location: perineal pain Quality: burning Severity: 4/10 on pain scale Duration: today Associated signs and symptoms: constipation  Past Medical History:  Diagnosis Date  . Anemia   . H/O menorrhagia   . Leukocytosis    OB History  Gravida Para Term Preterm AB Living  3 1 1  0 2 1  SAB TAB Ectopic Multiple Live Births  0 2 0 0 1    # Outcome Date GA Lbr Len/2nd Weight Sex Delivery Anes PTL Lv  3 Term 04/02/17 [id number] 07:08 / 01:06 3.13 kg (6 lb 14.4 oz) M Vag-Spont Local  LIV  2 TAB           1 TAB              Past Surgical History:  Procedure Laterality Date  . NO PAST SURGERIES     Social History   Social History  . Marital status: Single    Spouse name: N/A  . Number of children: 0  . Years of education: 9316   Occupational History  . [employer]    Social History Main Topics  . Smoking status: Never Smoker  . Smokeless tobacco: Never Used  . Alcohol use No     Comment: occaisional  . Drug use: No     Comment: Rare use in the past  . Sexual activity: Not Currently    Partners: Male    Birth control/ protection: None     Comment: Pregnant   Other Topics Concern  . Not on file   Social History Narrative   Lives at home w/ her best friend   Right-handed   Caffeine: rare   Family History  Problem Relation Age of Onset  . Migraines Mother   . Hypertension Mother   . Anemia Mother   . Hypertension Father   . High blood pressure Maternal Grandmother   .  Diabetes Maternal Grandmother    No current facility-administered medications on file prior to encounter.    Current Outpatient Prescriptions on File Prior to Encounter  Medication Sig Dispense Refill  . Prenatal Vit-Fe Fumarate-FA (PRENATAL MULTIVITAMIN) TABS tablet Take 1 tablet by mouth daily at 12 noon.     No Known Allergies  I have reviewed patient's Past Medical Hx, Surgical Hx, Family Hx, Social Hx, medications and allergies.   Review of Systems  Constitutional: Negative.   HENT: Negative.   Eyes: Negative.   Respiratory: Negative.   Cardiovascular: Negative.   Gastrointestinal: Positive for constipation.  Endocrine: Negative.   Genitourinary: Positive for vaginal pain.  Musculoskeletal: Negative.   Allergic/Immunologic: Negative.   Neurological: Negative.   Hematological: Negative.   Psychiatric/Behavioral: Negative.    Review of Systems  Constitutional: Negative.   HENT: Negative.   Eyes: Negative.   Respiratory: Negative.   Cardiovascular: Negative.   Gastrointestinal: Positive for constipation.  Genitourinary: Positive for vaginal pain.  Musculoskeletal: Negative.   Neurological: Negative.   Psychiatric/Behavioral: Negative.       OBJECTIVE Patient  Vitals for the past 24 hrs:  BP Temp Temp src Pulse Resp SpO2  05/02/17 1511 127/76 98.1 F (36.7 C) Oral 68 16 97 %   Constitutional: Well-developed, well-nourished female in no acute distress.  Cardiovascular: normal rate Respiratory: normal rate and effort.  GI: Abd soft, non-tender,Pos BS x 4 MS: Extremities nontender, no edema, normal ROM Neurologic: Alert and oriented x 4.  GU: Neg CVAT.  SPECULUM EXAM: NEFG, small amount of lochia discharge, no blood noted, Perineum healing and intact.  Small area of granulation tissue noted.  Silver nitrate used to area.  No sutures noted.   No CMT.  Rectal exam done, good tone, no hemorroids or fissue.    LAB RESULTS No results found for this or any previous  visit (from the past 24 hour(s)).  Wet mount Neg clue, yeast and trich.  IMAGING No results found.  MAU COURSE Orders Placed This Encounter  Procedures  . Discharge patient Discharge disposition: 01-Home or Self Care; Discharge patient date: 05/02/2017   Meds ordered this encounter  Medications  . ibuprofen (ADVIL,MOTRIN) 200 MG tablet    Sig: Take 600 mg by mouth every 6 (six) hours as needed.  . ferrous sulfate 325 (65 FE) MG tablet    Sig: Take 325 mg by mouth daily with breakfast.    MDM Exam, wet mount  ASSESSMENT Perineal pain postpartum Constipation  PLAN Discharge home in stable condition. Discussed healing process and reassurance given.  Discussed foods and increased fluids for constipation.  Encouraged use of stool softener or Miralax. Follow-up Information    [facility] Follow up in 2 week(s).   Specialty:  Obstetrics and Gynecology Contact information: [address] [place] [id number] [phone]            Kenney Housemanrothero, Varshini Arrants Jean, CNM 05/02/2017  4:13 PM

## 2017-05-02 NOTE — MAU Note (Signed)
PP vaginal delivery 04/02/17 with a 3rd degree tear.  Today patient noticed stiches came out when she wiped using the bathroom.  Patient states pain increased at that time.  Taking ibuprofen 600mg .  Pain now 4/10.  Patient also c/o headache 6/10.

## 2019-04-02 ENCOUNTER — Emergency Department (HOSPITAL_COMMUNITY)
Admission: EM | Admit: 2019-04-02 | Discharge: 2019-04-02 | Disposition: A | Discharge status: Home / Self Care | Payer: BC Managed Care – PPO

## 2019-04-02 ENCOUNTER — Other Ambulatory Visit: Payer: Self-pay

## 2019-08-09 ENCOUNTER — Encounter (HOSPITAL_COMMUNITY): Payer: Self-pay | Admitting: Emergency Medicine

## 2019-08-09 ENCOUNTER — Other Ambulatory Visit: Payer: Self-pay

## 2019-08-09 ENCOUNTER — Emergency Department (HOSPITAL_COMMUNITY): Payer: BC Managed Care – PPO

## 2019-08-09 ENCOUNTER — Emergency Department (HOSPITAL_COMMUNITY)
Admission: EM | Admit: 2019-08-09 | Discharge: 2019-08-09 | Disposition: A | Discharge status: Home / Self Care | Payer: BC Managed Care – PPO | Attending: Emergency Medicine | Admitting: Emergency Medicine

## 2019-08-09 DIAGNOSIS — Y999 Unspecified external cause status: Secondary | ICD-10-CM | POA: Diagnosis not present

## 2019-08-09 DIAGNOSIS — W228XXA Striking against or struck by other objects, initial encounter: Secondary | ICD-10-CM | POA: Insufficient documentation

## 2019-08-09 DIAGNOSIS — S060X1A Concussion with loss of consciousness of 30 minutes or less, initial encounter: Secondary | ICD-10-CM | POA: Insufficient documentation

## 2019-08-09 DIAGNOSIS — F1210 Cannabis abuse, uncomplicated: Secondary | ICD-10-CM | POA: Diagnosis not present

## 2019-08-09 DIAGNOSIS — Y92009 Unspecified place in unspecified non-institutional (private) residence as the place of occurrence of the external cause: Secondary | ICD-10-CM | POA: Insufficient documentation

## 2019-08-09 DIAGNOSIS — S0990XA Unspecified injury of head, initial encounter: Secondary | ICD-10-CM | POA: Diagnosis present

## 2019-08-09 DIAGNOSIS — Y939 Activity, unspecified: Secondary | ICD-10-CM | POA: Diagnosis not present

## 2019-08-09 MED ORDER — PROCHLORPERAZINE MALEATE 10 MG PO TABS
10.0000 mg | ORAL_TABLET | Freq: Once | ORAL | Status: AC
  Administered 2019-08-09: 10 mg via ORAL
  Filled 2019-08-09: qty 1

## 2019-08-09 MED ORDER — DIPHENHYDRAMINE HCL 25 MG PO CAPS
25.0000 mg | ORAL_CAPSULE | Freq: Once | ORAL | Status: AC
  Administered 2019-08-09: 13:00:00 25 mg via ORAL
  Filled 2019-08-09: qty 1

## 2019-08-09 MED ORDER — KETOROLAC TROMETHAMINE 15 MG/ML IJ SOLN
15.0000 mg | Freq: Once | INTRAMUSCULAR | Status: AC
  Administered 2019-08-09: 15 mg via INTRAMUSCULAR
  Filled 2019-08-09: qty 1
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
## 2019-08-09 NOTE — ED Notes (Signed)
Patient transported to CT 
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
## 2019-08-09 NOTE — Discharge Instructions (Addendum)
Your CT today was negative.  You may alternate ibuprofen or Tylenol to help with your headache.   A concussion is a very mild traumatic brain injury caused by a bump, jolt or blow to the head, most people recover quickly and fully. You can experience a wide variety of symptoms including:   - Confusion      - Difficulty concentrating       - Trouble remembering new info  - Headache      - Dizziness        - Fuzzy or blurry vision  - Fatigue      - Balance problems      - Light sensitivity  - Mood swings     - Changes in sleep or difficulty sleeping   To help these symptoms improve make sure you are getting plenty of rest, avoid screen time, loud music and strenuous mental activities. Avoid any strenuous physical activities, once your symptoms have resolved a slow and gradual return to activity is recommended. It is very important that you avoid situations in which you might sustain a second head injury as this can be very dangerous and life threatening. You cannot be medically cleared to return to normal activities until you have followed up with your primary doctor or a concussion specialist for reevaluation.

## 2020-02-08 ENCOUNTER — Telehealth (INDEPENDENT_AMBULATORY_CARE_PROVIDER_SITE_OTHER): Payer: Self-pay | Admitting: Surgery

## 2020-02-08 NOTE — Telephone Encounter (Signed)
Opened in error
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
